# Patient Record
Sex: Female | Born: 1945 | Race: Black or African American | Hispanic: No | Marital: Married | Smoking: Former smoker
Health system: Southern US, Community
[De-identification: ages and names within clinical notes are randomized; demographics above are authoritative.]

## PROBLEM LIST (undated history)

## (undated) DIAGNOSIS — H269 Unspecified cataract: Secondary | ICD-10-CM

## (undated) DIAGNOSIS — H547 Unspecified visual loss: Secondary | ICD-10-CM

## (undated) DIAGNOSIS — G4733 Obstructive sleep apnea (adult) (pediatric): Secondary | ICD-10-CM

## (undated) DIAGNOSIS — R7303 Prediabetes: Secondary | ICD-10-CM

## (undated) DIAGNOSIS — M1990 Unspecified osteoarthritis, unspecified site: Secondary | ICD-10-CM

## (undated) DIAGNOSIS — G4730 Sleep apnea, unspecified: Secondary | ICD-10-CM

## (undated) DIAGNOSIS — K219 Gastro-esophageal reflux disease without esophagitis: Secondary | ICD-10-CM

## (undated) DIAGNOSIS — K589 Irritable bowel syndrome without diarrhea: Secondary | ICD-10-CM

## (undated) DIAGNOSIS — R6889 Other general symptoms and signs: Secondary | ICD-10-CM

## (undated) DIAGNOSIS — L409 Psoriasis, unspecified: Secondary | ICD-10-CM

## (undated) DIAGNOSIS — I10 Essential (primary) hypertension: Secondary | ICD-10-CM

## (undated) DIAGNOSIS — I341 Nonrheumatic mitral (valve) prolapse: Secondary | ICD-10-CM

## (undated) DIAGNOSIS — K922 Gastrointestinal hemorrhage, unspecified: Secondary | ICD-10-CM

## (undated) DIAGNOSIS — I509 Heart failure, unspecified: Secondary | ICD-10-CM

## (undated) DIAGNOSIS — K5790 Diverticulosis of intestine, part unspecified, without perforation or abscess without bleeding: Secondary | ICD-10-CM

## (undated) HISTORY — DX: Psoriasis, unspecified: L40.9

## (undated) HISTORY — DX: Unspecified osteoarthritis, unspecified site: M19.90

## (undated) HISTORY — DX: Obstructive sleep apnea (adult) (pediatric): G47.33

## (undated) HISTORY — PX: APPENDECTOMY: SHX54

## (undated) HISTORY — PX: CATARACT EXTRACTION: SUR2

## (undated) HISTORY — PX: FOOT SURGERY: SHX648

## (undated) HISTORY — DX: Diverticulosis of intestine, part unspecified, without perforation or abscess without bleeding: K57.90

## (undated) HISTORY — DX: Gastro-esophageal reflux disease without esophagitis: K21.9

## (undated) HISTORY — DX: Unspecified cataract: H26.9

## (undated) HISTORY — DX: Unspecified visual loss: H54.7

## (undated) HISTORY — DX: Nonrheumatic mitral (valve) prolapse: I34.1

## (undated) HISTORY — DX: Prediabetes: R73.03

## (undated) HISTORY — DX: Gastrointestinal hemorrhage, unspecified: K92.2

## (undated) HISTORY — DX: Irritable bowel syndrome, unspecified: K58.9

## (undated) HISTORY — DX: Other general symptoms and signs: R68.89

---

## 2006-12-15 HISTORY — PX: PARTIAL HYSTERECTOMY: SHX80

## 2019-10-30 ENCOUNTER — Emergency Department (HOSPITAL_COMMUNITY): Payer: Medicare Other

## 2019-10-30 ENCOUNTER — Encounter (HOSPITAL_COMMUNITY): Payer: Self-pay

## 2019-10-30 ENCOUNTER — Inpatient Hospital Stay (HOSPITAL_COMMUNITY)
Admission: EM | Admit: 2019-10-30 | Discharge: 2019-11-01 | DRG: 378 | Disposition: A | Discharge status: Home / Self Care | Payer: Medicare Other | Attending: Internal Medicine | Admitting: Internal Medicine

## 2019-10-30 ENCOUNTER — Other Ambulatory Visit: Payer: Self-pay

## 2019-10-30 DIAGNOSIS — I10 Essential (primary) hypertension: Secondary | ICD-10-CM

## 2019-10-30 DIAGNOSIS — Z8719 Personal history of other diseases of the digestive system: Secondary | ICD-10-CM

## 2019-10-30 DIAGNOSIS — K5731 Diverticulosis of large intestine without perforation or abscess with bleeding: Principal | ICD-10-CM

## 2019-10-30 DIAGNOSIS — R079 Chest pain, unspecified: Secondary | ICD-10-CM

## 2019-10-30 DIAGNOSIS — Z7982 Long term (current) use of aspirin: Secondary | ICD-10-CM

## 2019-10-30 DIAGNOSIS — Z79899 Other long term (current) drug therapy: Secondary | ICD-10-CM

## 2019-10-30 DIAGNOSIS — Z20828 Contact with and (suspected) exposure to other viral communicable diseases: Secondary | ICD-10-CM | POA: Diagnosis present

## 2019-10-30 DIAGNOSIS — I110 Hypertensive heart disease with heart failure: Secondary | ICD-10-CM | POA: Diagnosis present

## 2019-10-30 DIAGNOSIS — K625 Hemorrhage of anus and rectum: Secondary | ICD-10-CM | POA: Diagnosis present

## 2019-10-30 DIAGNOSIS — Z8711 Personal history of peptic ulcer disease: Secondary | ICD-10-CM

## 2019-10-30 DIAGNOSIS — Z8249 Family history of ischemic heart disease and other diseases of the circulatory system: Secondary | ICD-10-CM

## 2019-10-30 DIAGNOSIS — I509 Heart failure, unspecified: Secondary | ICD-10-CM

## 2019-10-30 DIAGNOSIS — K589 Irritable bowel syndrome without diarrhea: Secondary | ICD-10-CM | POA: Diagnosis present

## 2019-10-30 DIAGNOSIS — K635 Polyp of colon: Secondary | ICD-10-CM | POA: Diagnosis present

## 2019-10-30 DIAGNOSIS — Q438 Other specified congenital malformations of intestine: Secondary | ICD-10-CM

## 2019-10-30 DIAGNOSIS — Z833 Family history of diabetes mellitus: Secondary | ICD-10-CM

## 2019-10-30 DIAGNOSIS — D649 Anemia, unspecified: Secondary | ICD-10-CM

## 2019-10-30 DIAGNOSIS — K219 Gastro-esophageal reflux disease without esophagitis: Secondary | ICD-10-CM

## 2019-10-30 DIAGNOSIS — K922 Gastrointestinal hemorrhage, unspecified: Secondary | ICD-10-CM | POA: Diagnosis not present

## 2019-10-30 DIAGNOSIS — Z87891 Personal history of nicotine dependence: Secondary | ICD-10-CM

## 2019-10-30 DIAGNOSIS — Z90710 Acquired absence of both cervix and uterus: Secondary | ICD-10-CM

## 2019-10-30 DIAGNOSIS — G4730 Sleep apnea, unspecified: Secondary | ICD-10-CM | POA: Diagnosis present

## 2019-10-30 HISTORY — DX: Sleep apnea, unspecified: G47.30

## 2019-10-30 HISTORY — DX: Essential (primary) hypertension: I10

## 2019-10-30 HISTORY — DX: Heart failure, unspecified: I50.9

## 2019-10-30 LAB — CBC WITH DIFFERENTIAL/PLATELET
Abs Immature Granulocytes: 0.02 10*3/uL (ref 0.00–0.07)
Abs Immature Granulocytes: 0.02 10*3/uL (ref 0.00–0.07)
Abs Immature Granulocytes: 0.04 10*3/uL (ref 0.00–0.07)
Basophils Absolute: 0.1 10*3/uL (ref 0.0–0.1)
Basophils Absolute: 0 10*3/uL (ref 0.0–0.1)
Basophils Absolute: 0 10*3/uL (ref 0.0–0.1)
Basophils Relative: 1 %
Basophils Relative: 0 %
Basophils Relative: 0 %
Eosinophils Absolute: 0.1 10*3/uL (ref 0.0–0.5)
Eosinophils Absolute: 0 10*3/uL (ref 0.0–0.5)
Eosinophils Absolute: 0 10*3/uL (ref 0.0–0.5)
Eosinophils Relative: 1 %
Eosinophils Relative: 0 %
Eosinophils Relative: 0 %
HCT: 37.7 % (ref 36.0–46.0)
HCT: 31.9 % — ABNORMAL LOW (ref 36.0–46.0)
HCT: 36.8 % (ref 36.0–46.0)
Hemoglobin: 12.1 g/dL (ref 12.0–15.0)
Hemoglobin: 9.7 g/dL — ABNORMAL LOW (ref 12.0–15.0)
Hemoglobin: 12.1 g/dL (ref 12.0–15.0)
Immature Granulocytes: 0 %
Immature Granulocytes: 0 %
Immature Granulocytes: 0 %
Lymphocytes Relative: 37 %
Lymphocytes Relative: 51 %
Lymphocytes Relative: 26 %
Lymphs Abs: 2.7 10*3/uL (ref 0.7–4.0)
Lymphs Abs: 5 10*3/uL — ABNORMAL HIGH (ref 0.7–4.0)
Lymphs Abs: 3.1 10*3/uL (ref 0.7–4.0)
MCH: 29.8 pg (ref 26.0–34.0)
MCH: 29.6 pg (ref 26.0–34.0)
MCH: 29.4 pg (ref 26.0–34.0)
MCHC: 32.1 g/dL (ref 30.0–36.0)
MCHC: 30.4 g/dL (ref 30.0–36.0)
MCHC: 32.9 g/dL (ref 30.0–36.0)
MCV: 92.9 fL (ref 80.0–100.0)
MCV: 97.3 fL (ref 80.0–100.0)
MCV: 89.5 fL (ref 80.0–100.0)
Monocytes Absolute: 0.4 10*3/uL (ref 0.1–1.0)
Monocytes Absolute: 0.5 10*3/uL (ref 0.1–1.0)
Monocytes Absolute: 0.6 10*3/uL (ref 0.1–1.0)
Monocytes Relative: 5 %
Monocytes Relative: 5 %
Monocytes Relative: 5 %
Neutro Abs: 4.2 10*3/uL (ref 1.7–7.7)
Neutro Abs: 4.3 10*3/uL (ref 1.7–7.7)
Neutro Abs: 8.2 10*3/uL — ABNORMAL HIGH (ref 1.7–7.7)
Neutrophils Relative %: 56 %
Neutrophils Relative %: 44 %
Neutrophils Relative %: 69 %
Platelets: 257 10*3/uL (ref 150–400)
Platelets: 282 10*3/uL (ref 150–400)
Platelets: 209 10*3/uL (ref 150–400)
RBC: 4.06 MIL/uL (ref 3.87–5.11)
RBC: 3.28 MIL/uL — ABNORMAL LOW (ref 3.87–5.11)
RBC: 4.11 MIL/uL (ref 3.87–5.11)
RDW: 13.3 % (ref 11.5–15.5)
RDW: 13.3 % (ref 11.5–15.5)
RDW: 14.6 % (ref 11.5–15.5)
WBC: 7.4 10*3/uL (ref 4.0–10.5)
WBC: 9.9 10*3/uL (ref 4.0–10.5)
WBC: 12 10*3/uL — ABNORMAL HIGH (ref 4.0–10.5)
nRBC: 0 % (ref 0.0–0.2)
nRBC: 0 % (ref 0.0–0.2)
nRBC: 0 % (ref 0.0–0.2)

## 2019-10-30 LAB — COMPREHENSIVE METABOLIC PANEL
ALT: 14 U/L (ref 0–44)
AST: 21 U/L (ref 15–41)
Albumin: 3.6 g/dL (ref 3.5–5.0)
Alkaline Phosphatase: 62 U/L (ref 38–126)
Anion gap: 13 (ref 5–15)
BUN: 10 mg/dL (ref 8–23)
CO2: 24 mmol/L (ref 22–32)
Calcium: 8.7 mg/dL — ABNORMAL LOW (ref 8.9–10.3)
Chloride: 103 mmol/L (ref 98–111)
Creatinine, Ser: 0.66 mg/dL (ref 0.44–1.00)
GFR calc Af Amer: >60 mL/min (ref >60)
GFR calc non Af Amer: >60 mL/min (ref >60)
Glucose, Bld: 142 mg/dL — ABNORMAL HIGH (ref 70–99)
Potassium: 3.9 mmol/L (ref 3.5–5.1)
Sodium: 140 mmol/L (ref 135–145)
Total Bilirubin: 0.7 mg/dL (ref 0.3–1.2)
Total Protein: 6.4 g/dL — ABNORMAL LOW (ref 6.5–8.1)

## 2019-10-30 LAB — LIPASE, BLOOD: Lipase: 36 U/L (ref 11–51)

## 2019-10-30 LAB — PREPARE RBC (CROSSMATCH)

## 2019-10-30 LAB — SARS CORONAVIRUS 2 (TAT 6-24 HRS): SARS Coronavirus 2: NEGATIVE

## 2019-10-30 LAB — ABO/RH: ABO/RH(D): B POS

## 2019-10-30 LAB — BRAIN NATRIURETIC PEPTIDE: B Natriuretic Peptide: 72.1 pg/mL (ref 0.0–100.0)

## 2019-10-30 LAB — TROPONIN I (HIGH SENSITIVITY)
Troponin I (High Sensitivity): 9 ng/L (ref <18)
Troponin I (High Sensitivity): 10 ng/L (ref <18)

## 2019-10-30 MED ORDER — MORPHINE SULFATE (PF) 4 MG/ML IV SOLN
4.0000 mg | Freq: Once | INTRAVENOUS | Status: AC
  Administered 2019-10-30: 4 mg via INTRAVENOUS
  Filled 2019-10-30: qty 1

## 2019-10-30 MED ORDER — SODIUM CHLORIDE 0.9% FLUSH
3.0000 mL | Freq: Two times a day (BID) | INTRAVENOUS | Status: DC
  Administered 2019-10-30 – 2019-10-31 (×3): 3 mL via INTRAVENOUS

## 2019-10-30 MED ORDER — SODIUM CHLORIDE 0.9 % IV BOLUS
1000.0000 mL | Freq: Once | INTRAVENOUS | Status: AC
  Administered 2019-10-30: 1000 mL via INTRAVENOUS

## 2019-10-30 MED ORDER — DONEPEZIL HCL 23 MG PO TABS
23.0000 mg | ORAL_TABLET | Freq: Every day | ORAL | Status: DC
  Administered 2019-10-30 – 2019-10-31 (×2): 23 mg via ORAL
  Filled 2019-10-30 (×3): qty 1

## 2019-10-30 MED ORDER — PANTOPRAZOLE SODIUM 40 MG PO TBEC
40.0000 mg | DELAYED_RELEASE_TABLET | Freq: Two times a day (BID) | ORAL | Status: DC
  Administered 2019-10-30 – 2019-11-01 (×5): 40 mg via ORAL
  Filled 2019-10-30 (×5): qty 1

## 2019-10-30 MED ORDER — MONTELUKAST SODIUM 10 MG PO TABS
10.0000 mg | ORAL_TABLET | Freq: Every day | ORAL | Status: DC
  Administered 2019-10-30 – 2019-11-01 (×3): 10 mg via ORAL
  Filled 2019-10-30 (×3): qty 1

## 2019-10-30 MED ORDER — SODIUM CHLORIDE 0.9 % IV BOLUS
500.0000 mL | Freq: Once | INTRAVENOUS | Status: AC
  Administered 2019-10-30: 500 mL via INTRAVENOUS

## 2019-10-30 MED ORDER — IOHEXOL 350 MG/ML SOLN
100.0000 mL | Freq: Once | INTRAVENOUS | Status: AC | PRN
  Administered 2019-10-30: 100 mL via INTRAVENOUS

## 2019-10-30 MED ORDER — SODIUM CHLORIDE 0.9 % IV SOLN
10.0000 mL/h | Freq: Once | INTRAVENOUS | Status: AC
  Administered 2019-11-01: 08:00:00 via INTRAVENOUS

## 2019-10-30 MED ORDER — ACETAMINOPHEN 650 MG RE SUPP: 650.0000 mg | Freq: Four times a day (QID) | RECTAL | Status: DC | PRN

## 2019-10-30 MED ORDER — ACETAMINOPHEN 325 MG PO TABS
650.0000 mg | ORAL_TABLET | Freq: Four times a day (QID) | ORAL | Status: DC | PRN
  Administered 2019-10-30 – 2019-10-31 (×3): 650 mg via ORAL
  Filled 2019-10-30 (×3): qty 2

## 2019-10-30 MED ORDER — ONDANSETRON HCL 4 MG/2ML IJ SOLN
4.0000 mg | Freq: Once | INTRAMUSCULAR | Status: AC
  Administered 2019-10-30: 4 mg via INTRAVENOUS
  Filled 2019-10-30: qty 2

## 2019-10-30 NOTE — ED Notes (Signed)
Moderate size maroon colored bowel movement.

## 2019-10-30 NOTE — ED Notes (Signed)
Pt had witnessed syncopal episode while moving her bowels. HR down to 40. MD to bedside.

## 2019-10-30 NOTE — Consult Note (Signed)
Consult Note for [facility]  Reason for Consult: Hematochezia Referring Physician: [facility]  Hardie Pulley HPI: This is a 73 year old female with a PMH of CHF, HTN, and sleep apnea admitted for hematochezia.  She had a total of three blood bowel movements from her home and in the ER, initially, but she continued to have significant hematochezia in the ER.  It was to the point that she was hypotensive and she suffered with a vagal event.  Her HR dropped into the 40s.  Her HGB was noted to be at 12.1 on admission and then it dropped down to 9.7 g/dL.  A CTA was performed, but there was no evidence of any active bleeding.  Since that time her bleeding arrested and she is stable.  The CTA did reveal a pandiverticulosis.  Her last colonoscopy was in January when she was living in [place] and she reports having multiple procedures in the past.  There is a history of polyps, her report.  Past Medical History:  Diagnosis Date  . CHF (congestive heart failure) ([place])   . Hypertension   . Sleep apnea     Past Surgical History:  Procedure Laterality Date  . ABDOMINAL HYSTERECTOMY  2008   partial    History reviewed. No pertinent family history.  Social History:  reports that she has quit smoking. She has never used smokeless tobacco. She reports that she does not drink alcohol or use drugs.  Allergies: No Known Allergies  Medications:  Scheduled:  Continuous: . sodium chloride      Results for orders placed or performed during the hospital encounter of 10/30/19 (from the past 24 hour(s))  Comprehensive metabolic panel     Status: Abnormal   Collection Time: 10/30/19  1:16 AM  Result Value Ref Range   Sodium 140 135 - 145 mmol/L   Potassium 3.9 3.5 - 5.1 mmol/L   Chloride 103 98 - 111 mmol/L   CO2 24 22 - 32 mmol/L   Glucose, Bld 142 (H) 70 - 99 mg/dL   BUN 10 8 - 23 mg/dL   Creatinine, Ser 0.66 0.44 - 1.00 mg/dL   Calcium 8.7 (L) 8.9 - 10.3 mg/dL   Total Protein 6.4 (L) 6.5 - 8.1  g/dL   Albumin 3.6 3.5 - 5.0 g/dL   AST 21 15 - 41 U/L   ALT 14 0 - 44 U/L   Alkaline Phosphatase 62 38 - 126 U/L   Total Bilirubin 0.7 0.3 - 1.2 mg/dL   GFR calc non Af Amer >60 >60 mL/min   GFR calc Af Amer >60 >60 mL/min   Anion gap 13 5 - 15  Lipase, blood     Status: None   Collection Time: 10/30/19  1:16 AM  Result Value Ref Range   Lipase 36 11 - 51 U/L  CBC with Differential     Status: None   Collection Time: 10/30/19  1:16 AM  Result Value Ref Range   WBC 7.4 4.0 - 10.5 K/uL   RBC 4.06 3.87 - 5.11 MIL/uL   Hemoglobin 12.1 12.0 - 15.0 g/dL   HCT 37.7 36.0 - 46.0 %   MCV 92.9 80.0 - 100.0 fL   MCH 29.8 26.0 - 34.0 pg   MCHC 32.1 30.0 - 36.0 g/dL   RDW 13.3 11.5 - 15.5 %   Platelets 257 150 - 400 K/uL   nRBC 0.0 0.0 - 0.2 %   Neutrophils Relative % 56 %   Neutro  Abs 4.2 1.7 - 7.7 K/uL   Lymphocytes Relative 37 %   Lymphs Abs 2.7 0.7 - 4.0 K/uL   Monocytes Relative 5 %   Monocytes Absolute 0.4 0.1 - 1.0 K/uL   Eosinophils Relative 1 %   Eosinophils Absolute 0.1 0.0 - 0.5 K/uL   Basophils Relative 1 %   Basophils Absolute 0.1 0.0 - 0.1 K/uL   Immature Granulocytes 0 %   Abs Immature Granulocytes 0.02 0.00 - 0.07 K/uL  Troponin I (High Sensitivity)     Status: None   Collection Time: 10/30/19  1:16 AM  Result Value Ref Range   Troponin I (High Sensitivity) 9 <18 ng/L  Brain natriuretic peptide     Status: None   Collection Time: 10/30/19  1:17 AM  Result Value Ref Range   B Natriuretic Peptide 72.1 0.0 - 100.0 pg/mL  Type and screen [place]     Status: None (Preliminary result)   Collection Time: 10/30/19  2:35 AM  Result Value Ref Range   ABO/RH(D) B POS    Antibody Screen NEG    Sample Expiration 11/02/2019,2359    Unit Number [id number]    Blood Component Type RED CELLS,LR    Unit division 00    Status of Unit ISSUED    Transfusion Status OK TO TRANSFUSE    Crossmatch Result Compatible    Unit Number [id number]    Blood  Component Type RED CELLS,LR    Unit division 00    Status of Unit ISSUED    Transfusion Status OK TO TRANSFUSE    Crossmatch Result      Compatible Performed at [facility], [place]. [address] [place], [place] [address]   ABO/Rh     Status: None   Collection Time: 10/30/19  2:35 AM  Result Value Ref Range   ABO/RH(D)      B POS Performed at [facility] [address] [place], [place] [address]   CBC with Differential     Status: Abnormal   Collection Time: 10/30/19  2:54 AM  Result Value Ref Range   WBC 9.9 4.0 - 10.5 K/uL   RBC 3.28 (L) 3.87 - 5.11 MIL/uL   Hemoglobin 9.7 (L) 12.0 - 15.0 g/dL   HCT 31.9 (L) 36.0 - 46.0 %   MCV 97.3 80.0 - 100.0 fL   MCH 29.6 26.0 - 34.0 pg   MCHC 30.4 30.0 - 36.0 g/dL   RDW 13.3 11.5 - 15.5 %   Platelets 282 150 - 400 K/uL   nRBC 0.0 0.0 - 0.2 %   Neutrophils Relative % 44 %   Neutro Abs 4.3 1.7 - 7.7 K/uL   Lymphocytes Relative 51 %   Lymphs Abs 5.0 (H) 0.7 - 4.0 K/uL   Monocytes Relative 5 %   Monocytes Absolute 0.5 0.1 - 1.0 K/uL   Eosinophils Relative 0 %   Eosinophils Absolute 0.0 0.0 - 0.5 K/uL   Basophils Relative 0 %   Basophils Absolute 0.0 0.0 - 0.1 K/uL   Immature Granulocytes 0 %   Abs Immature Granulocytes 0.02 0.00 - 0.07 K/uL  Prepare RBC     Status: None   Collection Time: 10/30/19  2:56 AM  Result Value Ref Range   Order Confirmation      ORDER PROCESSED BY BLOOD BANK Performed at [facility], [address] [place], [place] [address]   Troponin I (High Sensitivity)     Status:  None   Collection Time: 10/30/19  3:16 AM  Result Value Ref Range   Troponin I (High Sensitivity) 10 <18 ng/L     Dg Chest Portable 1 View  Result Date: 10/30/2019 CLINICAL DATA:  Chest pain EXAM: PORTABLE CHEST 1 VIEW COMPARISON:  None. FINDINGS: The heart size and mediastinal contours are within normal limits. Both lungs are clear. The visualized skeletal structures are unremarkable. IMPRESSION: No active disease.  Electronically Signed   By: Inez Catalina M.D.   On: 10/30/2019 01:29   Ct Angio Abd/pel W And/or Wo Contrast  Result Date: 10/30/2019 CLINICAL DATA:  Lower abdominal and mid chest pain beginning last evening. Lower GI bleeding. EXAM: CTA ABDOMEN AND PELVIS WITHOUT AND WITH CONTRAST TECHNIQUE: Multidetector CT imaging of the abdomen and pelvis was performed using the standard protocol during bolus administration of intravenous contrast. Multiplanar reconstructed images and MIPs were obtained and reviewed to evaluate the vascular anatomy. CONTRAST:  137mL OMNIPAQUE IOHEXOL 350 MG/ML SOLN COMPARISON:  None. FINDINGS: VASCULAR Aorta: The descending thoracic aorta is noted to be markedly tortuous but of normal caliber. Normal caliber the abdominal aorta. The abdominal aorta is widely patent without evidence of a hemodynamically significant stenosis. No abdominal aortic dissection or periaortic stranding. Celiac: Widely patent without hemodynamically significant narrowing. Conventional branching pattern. SMA: Widely patent without a hemodynamically significant narrowing. Conventional branching pattern. Renals: Solitary bilaterally; the bilateral renal arteries are widely patent without hemodynamically significant narrowing. No vessel irregularity to suggest FMD. IMA: Widely patent without hemodynamically significant narrowing. Inflow: The bilateral common and external iliac arteries are tortuous but of normal caliber and without a hemodynamically significant stenosis. The bilateral internal iliac arteries are of normal caliber and widely patent. Proximal Outflow: The bilateral common and imaged portions of the bilateral deep and superficial femoral arteries are widely patent without hemodynamically significant narrowing. Veins: The IVC and pelvic venous systems are widely patent without a hemodynamically significant narrowing. Review of the MIP images confirms the above findings.  _________________________________________________________ NON-VASCULAR Lower chest: Limited visualization of the lower thorax demonstrates minimal subsegmental atelectasis involving the caudal aspect of the lingula and adjacent to the tortuous descending thoracic aorta. No discrete focal airspace opacities. No pleural effusion. Normal heart size.  No pericardial effusion. Hepatobiliary: Mild nodularity of the hepatic contour. Scattered punctate (subcentimeter) hypoattenuating hepatic lesions are too small to adequately characterize. Normal appearance of the gallbladder given degree distention. No radiopaque gallstones. No intra or extrahepatic biliary ductal dilatation. No ascites. Pancreas: Normal appearance of the pancreas. Spleen: Normal appearance of the spleen. Adrenals/Urinary Tract: There is symmetric enhancement and excretion of the bilateral kidneys. No definite renal stones on this postcontrast examination. No discrete renal lesions. No urinary obstruction or perinephric stranding. There is mild thickening of the bilateral adrenal glands without discrete nodule. Normal appearance of the urinary bladder given degree distention. Stomach/Bowel: Rather extensive colonic diverticulosis without evidence of superimposed acute diverticulitis. Normal appearance of the terminal ileum. The appendix is not visualized, however there is no pericecal inflammatory change. No pneumoperitoneum, pneumatosis or portal venous gas. There are no discrete areas of intraluminal contrast extravasation to identify a source of upper or lower GI bleeding. Lymphatic: No bulky retroperitoneal, mesenteric, pelvic or inguinal lymph adenopathy. Reproductive: Normal appearance of the pelvic organs. No discrete adnexal lesion. No free fluid the pelvic cul-de-sac. Other: Diffuse body wall anasarca. Note is made of a small peri umbilical hernia which contains a portion of nondilated loop of small bowel. Musculoskeletal: No acute or aggressive  osseous abnormalities. Mild-to-moderate multilevel lumbar spine DDD. IMPRESSION: 1. No acute findings within the abdomen or pelvis. Specifically, a definitive source of GI bleeding is not identified. 2. Unremarkable CTA of the abdomen and pelvis. 3. Extensive colonic diverticulosis without evidence of superimposed acute diverticulitis. Electronically Signed   By: Sandi Mariscal M.D.   On: 10/30/2019 08:34    ROS:  As stated above in the HPI otherwise negative.  Blood pressure (!) 158/116, pulse 75, temperature 98.3 F (36.8 C), temperature source Oral, resp. rate (!) 23, SpO2 100 %.    PE: Gen: NAD, Alert and Oriented HEENT:  [place]/AT, EOMI Neck: Supple, no LAD Lungs: CTA Bilaterally CV: RRR without M/G/R ABM: Soft, NTND, +BS, obese Ext: No C/C/E  Assessment/Plan: 1) Diverticular bleed. 2) Anemia. 3) History of polyps.   Unfortunately the records for her prior procedures cannot be obtained.  Her last procedure was in January, per her report.  She most likely has a diverticular bleed, but in the absence of reviewing prior records, and the severity of her bleed, she will be scheduled for a colonoscopy tomorrow.  Currently she is hemodynamically stable  Plan: 1) Colonoscopy tomorrow with Dr. Loletha Carrow. 2) Follow HGB.  Dajsha Massaro D 10/30/2019, 9:55 AM

## 2019-10-30 NOTE — ED Provider Notes (Signed)
[facility] EMERGENCY DEPARTMENT Provider Note   CSN: [id number] Arrival date & time: 10/30/19  0031     History   Chief Complaint Chief Complaint  Patient presents with  . Rectal Bleeding    HPI Yolanda Lang is a 73 y.o. female with history of hypertension, CHF who presents with rectal bleeding that began this evening as patient was getting ready for bed.  Patient reports she was sitting on the toilet trying to have a bowel movement when she thought she was urinating, but she wiped and saw straight blood.  She passed several clots.  She has had 2 or 3 more episodes of large amounts of blood with clots since 11 PM.  Patient reports she still feels like she has to have a bowel movement, but denies any significant pain.  She had a little bit of nausea, but no vomiting.  She has had some intermittent lightheadedness.  Patient reports for the past week she has had intermittent chest discomfort that she states prevents her from lying flat.  She denies any significant shortness of breath.  She has noted some increased leg swelling bilaterally.  She takes Lasix for this.  Patient has no history of rectal bleeding.     HPI  Past Medical History:  Diagnosis Date  . CHF (congestive heart failure) ([place])   . Hypertension   . Sleep apnea     There are no active problems to display for this patient.   Past Surgical History:  Procedure Laterality Date  . ABDOMINAL HYSTERECTOMY  2008   partial     OB History   No obstetric history on file.      Home Medications    Prior to Admission medications   Medication Sig Start Date End Date Taking? Authorizing Provider  aspirin EC 81 MG tablet Take 81 mg by mouth daily.   Yes [provider]  clotrimazole-betamethasone (LOTRISONE) cream Apply 1 application topically 2 (two) times daily as needed (for rash).   Yes [provider]  diphenoxylate-atropine (LOMOTIL) 2.5-0.025 MG tablet Take 1-2 tablets by mouth  2 (two) times daily as needed for diarrhea or loose stools.   Yes [provider]  donepezil (ARICEPT) 23 MG TABS tablet Take 23 mg by mouth at bedtime. 10/20/19  Yes [provider]  furosemide (LASIX) 40 MG tablet Take 40 mg by mouth daily. 09/20/19  Yes [provider]  montelukast (SINGULAIR) 10 MG tablet Take 10 mg by mouth daily. 10/19/19  Yes [provider]  pantoprazole (PROTONIX) 40 MG tablet Take 40 mg by mouth 2 (two) times daily. 09/24/19  Yes [provider]  valsartan (DIOVAN) 320 MG tablet Take 320 mg by mouth daily. 09/20/19  Yes [provider]    Family History History reviewed. No pertinent family history.  Social History Social History   Tobacco Use  . Smoking status: Former Research scientist (life sciences)  . Smokeless tobacco: Never Used  . Tobacco comment: quit in 1975  Substance Use Topics  . Alcohol use: Never    Frequency: Never  . Drug use: Never     Allergies   Patient has no known allergies.   Review of Systems Review of Systems  Constitutional: Negative for chills and fever.  HENT: Negative for facial swelling and sore throat.   Respiratory: Negative for shortness of breath.   Cardiovascular: Positive for chest pain.  Gastrointestinal: Positive for blood in stool and nausea. Negative for abdominal pain and vomiting.  Genitourinary: Negative  for dysuria.  Musculoskeletal: Negative for back pain.  Skin: Negative for rash and wound.  Neurological: Positive for light-headedness. Negative for headaches.  Psychiatric/Behavioral: The patient is not nervous/anxious.      Physical Exam Updated Vital Signs BP 135/79   Pulse 71   Temp 98.3 F (36.8 C) (Oral)   Resp (!) 21   SpO2 100%   Physical Exam Vitals signs and nursing note reviewed.  Constitutional:      General: She is not in acute distress.    Appearance: She is well-developed. She is not diaphoretic.  HENT:     Head: Normocephalic and atraumatic.      Mouth/Throat:     Pharynx: No oropharyngeal exudate.  Eyes:     General: No scleral icterus.       Right eye: No discharge.        Left eye: No discharge.     Conjunctiva/sclera: Conjunctivae normal.     Pupils: Pupils are equal, round, and reactive to light.     Comments: Mildly pale conjunctiva  Neck:     Musculoskeletal: Normal range of motion and neck supple.     Thyroid: No thyromegaly.  Cardiovascular:     Rate and Rhythm: Normal rate and regular rhythm.     Heart sounds: Normal heart sounds. No murmur. No friction rub. No gallop.   Pulmonary:     Effort: Pulmonary effort is normal. No respiratory distress.     Breath sounds: Normal breath sounds. No stridor. No wheezing or rales.  Abdominal:     General: Bowel sounds are normal. There is no distension.     Palpations: Abdomen is soft.     Tenderness: There is no abdominal tenderness. There is no guarding or rebound.  Genitourinary:    Comments: Right red blood with dark clots noted in the bedside commode Lymphadenopathy:     Cervical: No cervical adenopathy.  Skin:    General: Skin is warm and dry.     Coloration: Skin is not pale.     Findings: No rash.  Neurological:     Mental Status: She is alert.     Coordination: Coordination normal.      ED Treatments / Results  Labs (all labs ordered are listed, but only abnormal results are displayed) Labs Reviewed  COMPREHENSIVE METABOLIC PANEL - Abnormal; Notable for the following components:      Result Value   Glucose, Bld 142 (*)    Calcium 8.7 (*)    Total Protein 6.4 (*)    All other components within normal limits  CBC WITH DIFFERENTIAL/PLATELET - Abnormal; Notable for the following components:   RBC 3.28 (*)    Hemoglobin 9.7 (*)    HCT 31.9 (*)    Lymphs Abs 5.0 (*)    All other components within normal limits  SARS CORONAVIRUS 2 (TAT 6-24 HRS)  LIPASE, BLOOD  CBC WITH DIFFERENTIAL/PLATELET  BRAIN NATRIURETIC PEPTIDE  URINALYSIS, ROUTINE W REFLEX  MICROSCOPIC  CBC WITH DIFFERENTIAL/PLATELET  TYPE AND SCREEN  PREPARE RBC (CROSSMATCH)  ABO/RH  TROPONIN I (HIGH SENSITIVITY)  TROPONIN I (HIGH SENSITIVITY)    EKG EKG Interpretation  Date/Time:  Sunday October 30 2019 01:04:03 EST Ventricular Rate:  94 PR Interval:    QRS Duration: 88 QT Interval:  410 QTC Calculation: 513 R Axis:   11 Text Interpretation: Sinus rhythm Multiple ventricular premature complexes Aberrant complex Probable left atrial enlargement Abnormal R-wave progression, early transition LVH with secondary repolarization abnormality Prolonged QT  interval No old tracing to compare Confirmed by Deno Etienne [phone]) on 10/30/2019 1:39:27 AM   Radiology Dg Chest Portable 1 View  Result Date: 10/30/2019 CLINICAL DATA:  Chest pain EXAM: PORTABLE CHEST 1 VIEW COMPARISON:  None. FINDINGS: The heart size and mediastinal contours are within normal limits. Both lungs are clear. The visualized skeletal structures are unremarkable. IMPRESSION: No active disease. Electronically Signed   By: Inez Catalina M.D.   On: 10/30/2019 01:29    Procedures .Critical Care Performed by: Frederica Kuster, PA-C Authorized by: Frederica Kuster, PA-C   Critical care provider statement:    Critical care time (minutes):  45   Critical care was necessary to treat or prevent imminent or life-threatening deterioration of the following conditions:  Circulatory failure   Critical care was time spent personally by me on the following activities:  Discussions with consultants, evaluation of patient's response to treatment, examination of patient, ordering and performing treatments and interventions, ordering and review of laboratory studies, ordering and review of radiographic studies, pulse oximetry, re-evaluation of patient's condition, obtaining history from patient or surrogate and review of old charts   I assumed direction of critical care for this patient from another provider in my specialty: no      (including critical care time)  Medications Ordered in ED Medications  0.9 %  sodium chloride infusion (has no administration in time range)  sodium chloride 0.9 % bolus 500 mL (0 mLs Intravenous Stopped 10/30/19 0444)  ondansetron (ZOFRAN) injection 4 mg (4 mg Intravenous Given 10/30/19 0300)  morphine 4 MG/ML injection 4 mg (4 mg Intravenous Given 10/30/19 0300)  sodium chloride 0.9 % bolus 1,000 mL (0 mLs Intravenous Stopped 10/30/19 0444)     Initial Impression / Assessment and Plan / ED Course  I have reviewed the triage vital signs and the nursing notes.  Pertinent labs & imaging results that were available during my care of the patient were reviewed by me and considered in my medical decision making (see chart for details).  Clinical Course as of Oct 29 708  Nancy Fetter Oct 30, 2019  0257 Myself and Dr. Tyrone Nine called to the patient's room by nursing staff after she had a syncopal episode while trying to have a bowel movement.  Patient had just had another large bowel movement with straight blood and clots.  Patient awakened, very pale.  Additional IV fluids started.  Will opt for transfusion at this time considering significant blood loss and patient accepts.  GI has been paged.   [AL]    Clinical Course User Index [AL] Frederica Kuster, PA-C       Patient with brisk suspected lower GI bleed.  Patient has no abdominal pain or tenderness.  Patient has had at least 8-10 episodes of large amounts of bright blood per rectum with clots.  Patient has had a 3 g drop in hemoglobin, which I suspect is not yet corrected.  Transfusion initiated following consent, risks explained.  Patient's blood pressure is much improved after IV fluids and following transfusion initiation.  I discussed patient case with gastroenterologist, Dr. Almyra Free, who advised CT angio of the abdomen to assess for site of bleeding.  If this is found, plan to consult interventional radiology.  Ultimately plan to admit to  medicine when more definitive plan. .At shift change, patient care transferred to Charlann Lange, PA-C for continued evaluation, follow up of CT angio abdomen and determination of plan and admit. Patient also evaluated by my attending,  Dr. Tyrone Nine, who guided the patient's management and agrees with plan.  Final Clinical Impressions(s) / ED Diagnoses   Final diagnoses:  Acute GI bleeding    ED Discharge Orders    None       Frederica Kuster, PA-C 10/30/19 [place], [place], DO 10/30/19 [phone]

## 2019-10-30 NOTE — ED Notes (Signed)
ED TO INPATIENT HANDOFF REPORT  ED Nurse Name and Phone #: [phone]  S Name/Age/Gender Yolanda Lang 73 y.o. female Room/Bed: 032C/032C  Code Status   Code Status: Not on file  Home/SNF/Other Home Patient oriented to: self, place, time and situation Is this baseline? Yes   Triage Complete: Triage complete  Chief Complaint rectal bleed   Triage Note Pt bib gcems after c/o rectal bleeding x 1 hour. Bright red blood. Pt also c/o intermittent chest pain that was relieved by tylenol and aspirin. Pt with hx of HTN with BP 220/100, all other VSS   Allergies No Known Allergies  Level of Care/Admitting Diagnosis ED Disposition    None      B Medical/Surgery History Past Medical History:  Diagnosis Date  . CHF (congestive heart failure) ([facility])   . Hypertension   . Sleep apnea    Past Surgical History:  Procedure Laterality Date  . ABDOMINAL HYSTERECTOMY  2008   partial     A IV Location/Drains/Wounds Patient Lines/Drains/Airways Status   Active Line/Drains/Airways    Name:   Placement date:   Placement time:   Site:   Days:   Peripheral IV 10/30/19 Right Hand   10/30/19    0140    Hand   less than 1   Peripheral IV 10/30/19 Left;Lateral Forearm   10/30/19    0354    Forearm   less than 1   Peripheral IV 10/30/19 Left Antecubital   10/30/19    0705    Antecubital   less than 1          Intake/Output Last 24 hours  Intake/Output Summary (Last 24 hours) at 10/30/2019 0919 Last data filed at 10/30/2019 0455 Gross per 24 hour  Intake 315 ml  Output -  Net 315 ml    Labs/Imaging Results for orders placed or performed during the hospital encounter of 10/30/19 (from the past 48 hour(s))  Comprehensive metabolic panel     Status: Abnormal   Collection Time: 10/30/19  1:16 AM  Result Value Ref Range   Sodium 140 135 - 145 mmol/L   Potassium 3.9 3.5 - 5.1 mmol/L   Chloride 103 98 - 111 mmol/L   CO2 24 22 - 32 mmol/L   Glucose, Bld 142 (H) 70 - 99 mg/dL   BUN  10 8 - 23 mg/dL   Creatinine, Ser 0.66 0.44 - 1.00 mg/dL   Calcium 8.7 (L) 8.9 - 10.3 mg/dL   Total Protein 6.4 (L) 6.5 - 8.1 g/dL   Albumin 3.6 3.5 - 5.0 g/dL   AST 21 15 - 41 U/L   ALT 14 0 - 44 U/L   Alkaline Phosphatase 62 38 - 126 U/L   Total Bilirubin 0.7 0.3 - 1.2 mg/dL   GFR calc non Af Amer >60 >60 mL/min   GFR calc Af Amer >60 >60 mL/min   Anion gap 13 5 - 15    Comment: Performed at [facility], [address], [place], [place] [address]  Lipase, blood     Status: None   Collection Time: 10/30/19  1:16 AM  Result Value Ref Range   Lipase 36 11 - 51 U/L    Comment: Performed at [facility] [address] [place], [place] [address]  CBC with Differential     Status: None   Collection Time: 10/30/19  1:16 AM  Result Value Ref Range   WBC 7.4 4.0 - 10.5 K/uL   RBC  4.06 3.87 - 5.11 MIL/uL   Hemoglobin 12.1 12.0 - 15.0 g/dL   HCT 37.7 36.0 - 46.0 %   MCV 92.9 80.0 - 100.0 fL   MCH 29.8 26.0 - 34.0 pg   MCHC 32.1 30.0 - 36.0 g/dL   RDW 13.3 11.5 - 15.5 %   Platelets 257 150 - 400 K/uL   nRBC 0.0 0.0 - 0.2 %   Neutrophils Relative % 56 %   Neutro Abs 4.2 1.7 - 7.7 K/uL   Lymphocytes Relative 37 %   Lymphs Abs 2.7 0.7 - 4.0 K/uL   Monocytes Relative 5 %   Monocytes Absolute 0.4 0.1 - 1.0 K/uL   Eosinophils Relative 1 %   Eosinophils Absolute 0.1 0.0 - 0.5 K/uL   Basophils Relative 1 %   Basophils Absolute 0.1 0.0 - 0.1 K/uL   Immature Granulocytes 0 %   Abs Immature Granulocytes 0.02 0.00 - 0.07 K/uL    Comment: Performed at [facility] [address], [place], [place] [address]  Troponin I (High Sensitivity)     Status: None   Collection Time: 10/30/19  1:16 AM  Result Value Ref Range   Troponin I (High Sensitivity) 9 <18 ng/L    Comment: (NOTE) Elevated high sensitivity troponin I (hsTnI) values and significant  changes across serial measurements may suggest ACS but many other  chronic and acute conditions are known to elevate hsTnI  results.  Refer to the "Links" section for chest pain algorithms and additional  guidance. Performed at [facility], [place] [address] [place], [place] [address]   Brain natriuretic peptide     Status: None   Collection Time: 10/30/19  1:17 AM  Result Value Ref Range   B Natriuretic Peptide 72.1 0.0 - 100.0 pg/mL    Comment: Performed at [facility] [address], [place], [place] [address]  Type and screen [place]     Status: None (Preliminary result)   Collection Time: 10/30/19  2:35 AM  Result Value Ref Range   ABO/RH(D) B POS    Antibody Screen NEG    Sample Expiration 11/02/2019,2359    Unit Number [id number]    Blood Component Type RED CELLS,LR    Unit division 00    Status of Unit ISSUED    Transfusion Status OK TO TRANSFUSE    Crossmatch Result Compatible    Unit Number [id number]    Blood Component Type RED CELLS,LR    Unit division 00    Status of Unit ISSUED    Transfusion Status OK TO TRANSFUSE    Crossmatch Result      Compatible Performed at [facility], [place] [address] [place], [place] [address]   ABO/Rh     Status: None   Collection Time: 10/30/19  2:35 AM  Result Value Ref Range   ABO/RH(D)      B POS Performed at [facility] [address], [place], [place] [address]   CBC with Differential     Status: Abnormal   Collection Time: 10/30/19  2:54 AM  Result Value Ref Range   WBC 9.9 4.0 - 10.5 K/uL   RBC 3.28 (L) 3.87 - 5.11 MIL/uL   Hemoglobin 9.7 (L) 12.0 - 15.0 g/dL   HCT 31.9 (L) 36.0 - 46.0 %   MCV 97.3 80.0 - 100.0 fL   MCH 29.6 26.0 - 34.0 pg   MCHC 30.4 30.0 - 36.0 g/dL  RDW 13.3 11.5 - 15.5 %   Platelets 282 150 - 400 K/uL   nRBC 0.0 0.0 - 0.2 %   Neutrophils Relative % 44 %   Neutro Abs 4.3 1.7 - 7.7 K/uL   Lymphocytes Relative 51 %   Lymphs Abs 5.0 (H) 0.7 - 4.0 K/uL   Monocytes Relative 5 %   Monocytes Absolute 0.5 0.1 - 1.0 K/uL   Eosinophils Relative 0 %   Eosinophils  Absolute 0.0 0.0 - 0.5 K/uL   Basophils Relative 0 %   Basophils Absolute 0.0 0.0 - 0.1 K/uL   Immature Granulocytes 0 %   Abs Immature Granulocytes 0.02 0.00 - 0.07 K/uL    Comment: Performed at [facility] [address], [place], [place] [address]  Prepare RBC     Status: None   Collection Time: 10/30/19  2:56 AM  Result Value Ref Range   Order Confirmation      ORDER PROCESSED BY BLOOD BANK Performed at [facility], [place] [address], [place], [place] [address]   Troponin I (High Sensitivity)     Status: None   Collection Time: 10/30/19  3:16 AM  Result Value Ref Range   Troponin I (High Sensitivity) 10 <18 ng/L    Comment: (NOTE) Elevated high sensitivity troponin I (hsTnI) values and significant  changes across serial measurements may suggest ACS but many other  chronic and acute conditions are known to elevate hsTnI results.  Refer to the "Links" section for chest pain algorithms and additional  guidance. Performed at [facility], [place] [address], [place], [place] [address]    Dg Chest Portable 1 View  Result Date: 10/30/2019 CLINICAL DATA:  Chest pain EXAM: PORTABLE CHEST 1 VIEW COMPARISON:  None. FINDINGS: The heart size and mediastinal contours are within normal limits. Both lungs are clear. The visualized skeletal structures are unremarkable. IMPRESSION: No active disease. Electronically Signed   By: Inez Catalina M.D.   On: 10/30/2019 01:29   Ct Angio Abd/pel W And/or Wo Contrast  Result Date: 10/30/2019 CLINICAL DATA:  Lower abdominal and mid chest pain beginning last evening. Lower GI bleeding. EXAM: CTA ABDOMEN AND PELVIS WITHOUT AND WITH CONTRAST TECHNIQUE: Multidetector CT imaging of the abdomen and pelvis was performed using the standard protocol during bolus administration of intravenous contrast. Multiplanar reconstructed images and MIPs were obtained and reviewed to evaluate the vascular anatomy. CONTRAST:  116mL OMNIPAQUE IOHEXOL 350 MG/ML  SOLN COMPARISON:  None. FINDINGS: VASCULAR Aorta: The descending thoracic aorta is noted to be markedly tortuous but of normal caliber. Normal caliber the abdominal aorta. The abdominal aorta is widely patent without evidence of a hemodynamically significant stenosis. No abdominal aortic dissection or periaortic stranding. Celiac: Widely patent without hemodynamically significant narrowing. Conventional branching pattern. SMA: Widely patent without a hemodynamically significant narrowing. Conventional branching pattern. Renals: Solitary bilaterally; the bilateral renal arteries are widely patent without hemodynamically significant narrowing. No vessel irregularity to suggest FMD. IMA: Widely patent without hemodynamically significant narrowing. Inflow: The bilateral common and external iliac arteries are tortuous but of normal caliber and without a hemodynamically significant stenosis. The bilateral internal iliac arteries are of normal caliber and widely patent. Proximal Outflow: The bilateral common and imaged portions of the bilateral deep and superficial femoral arteries are widely patent without hemodynamically significant narrowing. Veins: The IVC and pelvic venous systems are widely patent without a hemodynamically significant narrowing. Review of the MIP images confirms the above findings. _________________________________________________________ NON-VASCULAR Lower chest:  Limited visualization of the lower thorax demonstrates minimal subsegmental atelectasis involving the caudal aspect of the lingula and adjacent to the tortuous descending thoracic aorta. No discrete focal airspace opacities. No pleural effusion. Normal heart size.  No pericardial effusion. Hepatobiliary: Mild nodularity of the hepatic contour. Scattered punctate (subcentimeter) hypoattenuating hepatic lesions are too small to adequately characterize. Normal appearance of the gallbladder given degree distention. No radiopaque gallstones. No  intra or extrahepatic biliary ductal dilatation. No ascites. Pancreas: Normal appearance of the pancreas. Spleen: Normal appearance of the spleen. Adrenals/Urinary Tract: There is symmetric enhancement and excretion of the bilateral kidneys. No definite renal stones on this postcontrast examination. No discrete renal lesions. No urinary obstruction or perinephric stranding. There is mild thickening of the bilateral adrenal glands without discrete nodule. Normal appearance of the urinary bladder given degree distention. Stomach/Bowel: Rather extensive colonic diverticulosis without evidence of superimposed acute diverticulitis. Normal appearance of the terminal ileum. The appendix is not visualized, however there is no pericecal inflammatory change. No pneumoperitoneum, pneumatosis or portal venous gas. There are no discrete areas of intraluminal contrast extravasation to identify a source of upper or lower GI bleeding. Lymphatic: No bulky retroperitoneal, mesenteric, pelvic or inguinal lymph adenopathy. Reproductive: Normal appearance of the pelvic organs. No discrete adnexal lesion. No free fluid the pelvic cul-de-sac. Other: Diffuse body wall anasarca. Note is made of a small peri umbilical hernia which contains a portion of nondilated loop of small bowel. Musculoskeletal: No acute or aggressive osseous abnormalities. Mild-to-moderate multilevel lumbar spine DDD. IMPRESSION: 1. No acute findings within the abdomen or pelvis. Specifically, a definitive source of GI bleeding is not identified. 2. Unremarkable CTA of the abdomen and pelvis. 3. Extensive colonic diverticulosis without evidence of superimposed acute diverticulitis. Electronically Signed   By: Sandi Mariscal M.D.   On: 10/30/2019 08:34    Pending Labs Unresulted Labs (From admission, onward)    Start     Ordered   10/30/19 0655  CBC with Differential  Once,   STAT     10/30/19 0654   10/30/19 0335  SARS CORONAVIRUS 2 (TAT 6-24 HRS) Nasopharyngeal  Nasopharyngeal Swab  (Asymptomatic/Tier 2)  Once,   STAT    Question Answer Comment  Is this test for diagnosis or screening Screening   Symptomatic for COVID-19 as defined by CDC No   Hospitalized for COVID-19 No   Admitted to ICU for COVID-19 No   Previously tested for COVID-19 No   Resident in a congregate (group) care setting No   Employed in healthcare setting No   Pregnant No      10/30/19 0334   10/30/19 0117  Urinalysis, Routine w reflex microscopic  ONCE - STAT,   STAT     10/30/19 0116          Vitals/Pain Today's Vitals   10/30/19 0715 10/30/19 0730 10/30/19 0830 10/30/19 0850  BP: 135/77 (!) 148/125  (!) 135/97  Pulse: 89 77 83 79  Resp: 17 17 18 18   Temp:   98.4 F (36.9 C) 98.3 F (36.8 C)  TempSrc:   Oral Oral  SpO2: 100% 100% 100%   PainSc:   0-No pain     Isolation Precautions No active isolations  Medications Medications  0.9 %  sodium chloride infusion (has no administration in time range)  sodium chloride 0.9 % bolus 500 mL (0 mLs Intravenous Stopped 10/30/19 0444)  ondansetron (ZOFRAN) injection 4 mg (4 mg Intravenous Given 10/30/19 0300)  morphine 4 MG/ML injection 4  mg (4 mg Intravenous Given 10/30/19 0300)  sodium chloride 0.9 % bolus 1,000 mL (0 mLs Intravenous Stopped 10/30/19 0444)  iohexol (OMNIPAQUE) 350 MG/ML injection 100 mL (100 mLs Intravenous Contrast Given 10/30/19 0802)    Mobility walks Moderate fall risk   Focused Assessments    R Recommendations: See Admitting Provider Note  Report given to:   Additional Notes:

## 2019-10-30 NOTE — ED Notes (Signed)
0700 am CBD not drawn blood infusing.

## 2019-10-30 NOTE — ED Triage Notes (Signed)
Pt bib gcems after c/o rectal bleeding x 1 hour. Bright red blood. Pt also c/o intermittent chest pain that was relieved by tylenol and aspirin. Pt with hx of HTN with BP 220/100, all other VSS

## 2019-10-30 NOTE — H&P (Addendum)
Date: 10/30/2019               Patient Name:  Yolanda Lang MRN: [id number]  DOB: [birth year] Age / Sex: 73 y.o., female   PCP: Loretha Brasil, FNP              Medical Service: Internal Medicine Teaching Service              Attending Physician: Dr. Lucious Groves, DO    First Contact: Roxan Diesel, MS 3 Pager: [phone]  Second Contact: Dr. Darrick Meigs Pager: [phone]  Third Contact Dr. Koleen Distance  Pager: [phone]       After Hours (After 5p/  First Contact Pager: [phone]  weekends / holidays): Second Contact Pager: [phone]   Chief Complaint: Rectal bleeding  History of Present Illness: Yolanda Lang is a 73 y.o. female with PMHx of diverticulosis, CHF, HTN, GERD, gastric ulcer, and IBS who presents with bright red blood per rectum.  Patient states she was sitting on the toilet attempting to have a bowel movement last night around 11pm when symptoms started.  She initially thought she was urinating but then wiped and saw blood, and noticed the toilet was filling with blood.  She also became very lightheaded and weak   She denies rectal pain or abdominal pain although she subsequently developed a "twinge" of abdominal pain after arrival to the ED.  She continued to bleed continuously after arrival to ED 1-2 hours later.  She was given 1500 mL IV NS.  She continued to have bleeding and 2-3 episodes of passing larger amounts of blood with clots, and had a syncopal episode while moving her bowels at 3am, with HR down to 40 and BP 103/55.   Hgb was noted to drop from 12.1 at 1am to 9.7 at 3am so she was transfused 2 units pRBC, with subsequent stabilization of her HR and BP.  She also received 4mg  morphine and a Zofran injection.   Bleeding gradually slowed down and she had a moderate-sized maroon colored BM at around 4am. By 8am bleeding had resolved completely.  Patient states that over the past 3 weeks she has felt generally weak and "sluggish."  She has also had a decreased appetite.  She  reports nausea but denies vomiting.  Over the past week she has also had intermittent chest pain.  Pain is localized to the epigastric area and sometimes lasts up to an entire day.  She describes it as "discomfort."  She is unable to think of any precipitating factors but it is not exertional or positional and seems to come on at random.  It may be relieved by rest.  Yesterday she took one 325mg  aspirin which helped.  She denies h/o MI but does report recent diagnosis of early-stage CHF, and was reportedly told by a cardiologist some time ago to take an aspirin if she ever had chest pain.  She reports associated lightheadedness and clamminess during these episodes.  She reports intermittent SOB which she attributes to CHF but denies SOB associated with chest pain.  She denies prior history of rectal bleeding.  She reports a history of diverticulosis and internal hemorrhoids diagnosed on colonoscopy but denies any prior complications or symptoms of these. She also has a history of IBS diagnosed in 1978 and has had abdominal pain prior to bowel movements on-and-off for years.  She is taking Lomotil for this.  She has had prior episodes of lightheadedness while using the restroom, including one episode last year  of severe lightheadedness, blurred vision and BP of 50/30 while using the bathroom.  She states her symptoms improved after drinking orange juice.  She went to the ED for this episode but was not hospitalized.  Last colonoscopy was 12/2018 and she may have had a few polyps but otherwise denies any new issues.   She also reports history of GERD which is well-controlled on pantoprazole, and gastric ulcer which was reportedly healed on last endoscopy in 12/2018.  She has only taken one 325mg  dose of aspirin in the past week, otherwise takes only 81mg  daily.     Meds:  Current Meds  Medication Sig  . aspirin EC 81 MG tablet Take 81 mg by mouth daily.  . clotrimazole-betamethasone (LOTRISONE) cream Apply 1  application topically 2 (two) times daily as needed (for rash).  . diphenoxylate-atropine (LOMOTIL) 2.5-0.025 MG tablet Take 1-2 tablets by mouth 2 (two) times daily as needed for diarrhea or loose stools.  . donepezil (ARICEPT) 23 MG TABS tablet Take 23 mg by mouth at bedtime.  . furosemide (LASIX) 40 MG tablet Take 40 mg by mouth daily.  . montelukast (SINGULAIR) 10 MG tablet Take 10 mg by mouth daily.  . pantoprazole (PROTONIX) 40 MG tablet Take 40 mg by mouth 2 (two) times daily.  . valsartan (DIOVAN) 320 MG tablet Take 320 mg by mouth daily.     Allergies: Allergies as of 10/30/2019  . (No Known Allergies)   Past Medical History:  Diagnosis Date  . CHF (congestive heart failure) ([facility])   . Hypertension   . Sleep apnea     Family History: She reports a family history of diabetes but denies family history of colon or ovarian cancer.  Social History: Recently moved from [place].  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  Distant smoking history (quit 45 years ago), no ETOH.  Has not been getting much exercise recently due to her fatigue.  Review of Systems: A complete ROS was negative except as per HPI.   Physical Exam: Blood pressure 124/88, pulse 70, temperature 98.3 F (36.8 C), temperature source Oral, resp. rate 18, SpO2 100 %. General: awake, alert, lying comfortably in bed in NAD HEENT: No scleral icterus Pulm: lungs clear to auscultation bilaterally, normal work of breathing on room air CV: regular rate and rhythm, no murmurs, rubs or gallups GI: abdomen soft, nontender, nondistended, normoactive bowel sounds MSK: no peripheral edema Neuro: A&Ox3; no focal deficits Skin: warm and dry  Psych: normal mood and affect  EKG: personally reviewed my interpretation is regular rate, sinus rhythm, normal axis, slightly prolonged QT interval, left ventricular hypertrophy, no ischemic changes  CXR: personally reviewed my interpretation is normal cardiac silhouette, no  pulmonary infiltrates or consolidation, no bony abnormalities  Assessment & Plan by Problem: Principal Problem:   GI bleed Active Problems:   Hypertension   GERD (gastroesophageal reflux disease)   CHF (congestive heart failure) ([facility])   Chest pain  GI bleed: Patient with copious rectal bleeding lasting ~8 hours and requiring 2u pRBC.  Had CTA which showed pandiverticulosis but did not reveal source of bleeding.  She is currently hemodynamically stable.  Leading diagnosis is diverticular hemorrhage given painless rectal bleeding with history of diverticulosis.  This may have been missed on CTA as her bleeding was resolving at that time.  Differential also includes colon cancer.  Relatively recent colonoscopy (12/2018) without evidence of cancer is reassuring but there is some concern given her recent decreased appetite and fatigue.  Also consider internal hemorrhoids although would not typically expect this quantity of blood loss or clotting.  Other possibilities include AVM vs Dieulafoy lesion, although both are less likely to cause lower GI bleed.  Mesenteric ischemia less likely given lack of pain. - GI consulted, planning for colonoscopy tomorrow - No further transfusion needed currently as her bleeding has resolved and vitals and symptoms have stabilized.   - Recheck CBC q6hrs - Hold IVF given BP now hypertensive, consider 533mL NS bolus if BP drops again  Chest pain: History is somewhat vague, difficult to characterize as cardiac vs non-cardiac based on description.  However she has had normal troponin x 2 and EKG with no evidence of ischemic changes, making ACS extremely unlikely. - Continue to monitor  Hypertension: Was taking furosemide 40mg  daily and valsartan 320mg  daily at home. - Hold BP medications given her drop in BP overnight and risk of further hemodynamic instability if bleeding were to resume  Chronic CHF: Patient reports that she was recently diagnosed with CHF (no records  available as patient recently moved from [place]).  However she has normal BNP with no signs of volume overload currently. - Hold home furosemide and aspirin 81mg , resume after bleeding has been addressed  GERD: Well-controlled on pantoprazole per patient - Continue home pantoprazole 40mg  BID   CODE STATUS: FULL Diet: NPO IV fluids: Saline lock, consider 552mL NS bolus if BP drops DVT prophylaxis: SCDs   Dispo: Admit patient to Observation with expected length of stay less than 2 midnights.  Signed: Tonia Ghent, Medical Student 10/30/2019, 11:28 AM  Pager: [phone]   Attestation for Student Documentation:  I personally was present and performed or re-performed the history, physical exam and medical decision-making activities of this service and have verified that the service and findings are accurately documented in the student's note.  Modena Nunnery D, DO 10/30/2019, 12:21 PM   Internal Medicine Attending:   I saw and examined the patient. I reviewed the resident's note and I agree with the resident's findings and plan as documented in the resident's note.  As noted this is a 73 year old female with past medical history of diverticulosis, gastric ulcer, hypertension, GERD, CHF and IBS who presented for painless hematochezia.  HPI is as noted above.  With the addition that I clarified that she is no history of heart attack or stroke she was instructed to take aspirin by her cardiologist due to her history of hypertension family history of coronary artery disease, she does report a cardiac catheterization in 2015 with no significant disease she does note a history of mitral valve prolapse.  On my examination of her she is in no distress she is resting comfortably.  Heart is regular rate and rhythm, lungs are clear to auscultation bilaterally abdomen soft nontender.  She has trace pedal edema bilaterally.  Assessment plan Agree with assessment plan as above  notably GI bleed suspected diverticular. -No further bleeding since last night GI had planned possible colonoscopy however given resolution of bleeding this may be deferred to the outpatient setting will follow up with GI. -I have instructed her to discontinue aspirin as it appears this is for primary prevention.  Hypertension -May resume antihypertensives  Chronic CHF -We do not know her last ejection fraction she will need local PCP to follow.

## 2019-10-30 NOTE — ED Provider Notes (Signed)
Patient to ED with copious rectal bleeding, including clots, since 11:00 pm last night. No similar history. She is taking ASA only - not anticoagulated. No abdominal pain. She is now having syncopal episodes. No chest pain, SOB, fever.   Patient care signed out to me by Armstead Peaks, PA-C (Dr. Tyrone Nine) pending CT angio of abdomen to identify source of bleeding as recommended by Dr. Benson Norway, GI. If source identified, IR may be helpful in treatment. She is currently getting transfused. Hemodynamically stable.   CT scan does not identify the source of bleed. On recheck, the patient is comfortable and has no complaints. She continues to have frequent bowel movements but with less and less bleeding. No further syncope. Still no pain.  Discussed CT findings with Dr. Benson Norway who will consult on the patient and plan for colonoscopy, likely tomorrow. Appropriate COVID test is pending. Admit to medicine.  Patient will be admitted to [facility], Internal medicine who has accepted her on to their service.    Charlann Lange, PA-C 10/30/19 [place], [place], DO 10/31/19 1359

## 2019-10-30 NOTE — ED Notes (Signed)
Orthostatic order discontinued per PA.

## 2019-10-30 NOTE — ED Notes (Signed)
Last bowel movement didn't see any blood in it.

## 2019-10-31 ENCOUNTER — Encounter (HOSPITAL_COMMUNITY)
Admission: EM | Disposition: A | Discharge status: Home / Self Care | Payer: Self-pay | Source: Home / Self Care | Attending: Internal Medicine

## 2019-10-31 DIAGNOSIS — K921 Melena: Secondary | ICD-10-CM

## 2019-10-31 DIAGNOSIS — Z90710 Acquired absence of both cervix and uterus: Secondary | ICD-10-CM | POA: Diagnosis not present

## 2019-10-31 DIAGNOSIS — R079 Chest pain, unspecified: Secondary | ICD-10-CM

## 2019-10-31 DIAGNOSIS — Z8711 Personal history of peptic ulcer disease: Secondary | ICD-10-CM | POA: Diagnosis not present

## 2019-10-31 DIAGNOSIS — D62 Acute posthemorrhagic anemia: Secondary | ICD-10-CM | POA: Diagnosis not present

## 2019-10-31 DIAGNOSIS — Z87891 Personal history of nicotine dependence: Secondary | ICD-10-CM | POA: Diagnosis not present

## 2019-10-31 DIAGNOSIS — Z79899 Other long term (current) drug therapy: Secondary | ICD-10-CM

## 2019-10-31 DIAGNOSIS — I110 Hypertensive heart disease with heart failure: Secondary | ICD-10-CM

## 2019-10-31 DIAGNOSIS — K5790 Diverticulosis of intestine, part unspecified, without perforation or abscess without bleeding: Secondary | ICD-10-CM | POA: Diagnosis not present

## 2019-10-31 DIAGNOSIS — D649 Anemia, unspecified: Secondary | ICD-10-CM | POA: Diagnosis present

## 2019-10-31 DIAGNOSIS — Z9889 Other specified postprocedural states: Secondary | ICD-10-CM

## 2019-10-31 DIAGNOSIS — K635 Polyp of colon: Secondary | ICD-10-CM | POA: Diagnosis present

## 2019-10-31 DIAGNOSIS — D128 Benign neoplasm of rectum: Secondary | ICD-10-CM | POA: Diagnosis not present

## 2019-10-31 DIAGNOSIS — Z833 Family history of diabetes mellitus: Secondary | ICD-10-CM | POA: Diagnosis not present

## 2019-10-31 DIAGNOSIS — K625 Hemorrhage of anus and rectum: Secondary | ICD-10-CM | POA: Diagnosis present

## 2019-10-31 DIAGNOSIS — K219 Gastro-esophageal reflux disease without esophagitis: Secondary | ICD-10-CM

## 2019-10-31 DIAGNOSIS — K922 Gastrointestinal hemorrhage, unspecified: Secondary | ICD-10-CM

## 2019-10-31 DIAGNOSIS — Z8249 Family history of ischemic heart disease and other diseases of the circulatory system: Secondary | ICD-10-CM | POA: Diagnosis not present

## 2019-10-31 DIAGNOSIS — I509 Heart failure, unspecified: Secondary | ICD-10-CM | POA: Diagnosis present

## 2019-10-31 DIAGNOSIS — K589 Irritable bowel syndrome without diarrhea: Secondary | ICD-10-CM

## 2019-10-31 DIAGNOSIS — Q438 Other specified congenital malformations of intestine: Secondary | ICD-10-CM | POA: Diagnosis not present

## 2019-10-31 DIAGNOSIS — R194 Change in bowel habit: Secondary | ICD-10-CM | POA: Diagnosis not present

## 2019-10-31 DIAGNOSIS — K5731 Diverticulosis of large intestine without perforation or abscess with bleeding: Secondary | ICD-10-CM | POA: Diagnosis present

## 2019-10-31 DIAGNOSIS — G4730 Sleep apnea, unspecified: Secondary | ICD-10-CM | POA: Diagnosis present

## 2019-10-31 DIAGNOSIS — Z8719 Personal history of other diseases of the digestive system: Secondary | ICD-10-CM | POA: Diagnosis not present

## 2019-10-31 DIAGNOSIS — Z7982 Long term (current) use of aspirin: Secondary | ICD-10-CM | POA: Diagnosis not present

## 2019-10-31 DIAGNOSIS — K5791 Diverticulosis of intestine, part unspecified, without perforation or abscess with bleeding: Secondary | ICD-10-CM | POA: Diagnosis not present

## 2019-10-31 DIAGNOSIS — Z20828 Contact with and (suspected) exposure to other viral communicable diseases: Secondary | ICD-10-CM | POA: Diagnosis present

## 2019-10-31 LAB — CBC
HCT: 37 % (ref 36.0–46.0)
Hemoglobin: 11.9 g/dL — ABNORMAL LOW (ref 12.0–15.0)
MCH: 29.5 pg (ref 26.0–34.0)
MCHC: 32.2 g/dL (ref 30.0–36.0)
MCV: 91.6 fL (ref 80.0–100.0)
Platelets: 228 10*3/uL (ref 150–400)
RBC: 4.04 MIL/uL (ref 3.87–5.11)
RDW: 14.9 % (ref 11.5–15.5)
WBC: 9.2 10*3/uL (ref 4.0–10.5)
nRBC: 0 % (ref 0.0–0.2)

## 2019-10-31 LAB — TYPE AND SCREEN
ABO/RH(D): B POS
Antibody Screen: NEGATIVE
Unit division: 0
Unit division: 0

## 2019-10-31 LAB — BPAM RBC
Blood Product Expiration Date: 202012042359
Blood Product Expiration Date: 202012092359
ISSUE DATE / TIME: 202011150440
ISSUE DATE / TIME: 202011150823
Unit Type and Rh: 1700
Unit Type and Rh: 1700

## 2019-10-31 LAB — BASIC METABOLIC PANEL
Anion gap: 10 (ref 5–15)
BUN: 7 mg/dL — ABNORMAL LOW (ref 8–23)
CO2: 29 mmol/L (ref 22–32)
Calcium: 8.8 mg/dL — ABNORMAL LOW (ref 8.9–10.3)
Chloride: 104 mmol/L (ref 98–111)
Creatinine, Ser: 0.77 mg/dL (ref 0.44–1.00)
GFR calc Af Amer: >60 mL/min (ref >60)
GFR calc non Af Amer: >60 mL/min (ref >60)
Glucose, Bld: 94 mg/dL (ref 70–99)
Potassium: 3.3 mmol/L — ABNORMAL LOW (ref 3.5–5.1)
Sodium: 143 mmol/L (ref 135–145)

## 2019-10-31 SURGERY — COLONOSCOPY WITH PROPOFOL
Anesthesia: Monitor Anesthesia Care

## 2019-10-31 MED ORDER — PEG-KCL-NACL-NASULF-NA ASC-C 100 G PO SOLR: 1.0000 | Freq: Once | ORAL | Status: DC

## 2019-10-31 MED ORDER — PEG-KCL-NACL-NASULF-NA ASC-C 100 G PO SOLR
0.5000 | Freq: Once | ORAL | Status: AC
  Administered 2019-11-01: 100 g via ORAL

## 2019-10-31 MED ORDER — PEG 3350-KCL-NA BICARB-NACL 420 G PO SOLR
4000.0000 mL | Freq: Once | ORAL | Status: DC
  Filled 2019-10-31: qty 4000

## 2019-10-31 MED ORDER — PEG-KCL-NACL-NASULF-NA ASC-C 100 G PO SOLR
0.5000 | Freq: Once | ORAL | Status: AC
  Administered 2019-10-31: 100 g via ORAL
  Filled 2019-10-31: qty 1

## 2019-10-31 MED ORDER — IRBESARTAN 300 MG PO TABS
300.0000 mg | ORAL_TABLET | Freq: Every day | ORAL | Status: DC
  Administered 2019-10-31 – 2019-11-01 (×2): 300 mg via ORAL
  Filled 2019-10-31 (×2): qty 1

## 2019-10-31 NOTE — Progress Notes (Signed)
   Subjective:  No acute events overnight.  VSS.  She is feeling slightly lightheaded currently but otherwise feels well.  No further bleeding.  Has not had BM today.  She does complain of some RUQ pain which feels similar to her prior gastric ulcer.  No further chest pain since arrival to the hospital.  Did not receive prep last night.  Objective:  Vital signs in last 24 hours: Vitals:   10/31/19 0503 10/31/19 0506 10/31/19 0510 10/31/19 0807  BP: (!) 158/88 (!) 147/83 (!) 150/88 (!) 178/91  Pulse: 71 67 72 73  Resp:    18  Temp: 98.6 F (37 C)   98.6 F (37 C)  TempSrc: Oral   Oral  SpO2: 97% 97% 94% 98%   Weight change:   Intake/Output Summary (Last 24 hours) at 10/31/2019 1255 Last data filed at 10/31/2019 0900 Gross per 24 hour  Intake 600 ml  Output -  Net 600 ml    General: awake, alert, lying comfortably in bed in NAD Pulm: normal work of breathing on room air CV: regular rate and rhythm, no murmurs, rubs or gallups GI: abdomen soft, nontender, nondistended MSK: no peripheral edema Psych: normal mood and affect      Assessment/Plan:  Principal Problem:   GI bleed Active Problems:   Hypertension   GERD (gastroesophageal reflux disease)   CHF (congestive heart failure) ([place])   74 y.o. female with PMHx of diverticulosis, CHF, HTN, GERD, gastric ulcer, and IBS who presented to the ED on 10/30/2019 with bright red blood per rectum.   GI bleed: Bleeding has now resolved.  Hgb stable overnight from 12.1 -> 11.9, with last transfusion yesterday morning.  She is hemodynamically stable.  No need for further IVF or transfusion at this time.  Etiology still unclear given no source of finding seen on CTA yesterday, but diverticular hemorrhage continues to be leading diagnosis. - Will have colonoscopy tomorrow as an inpatient because she did not receive prep last night - Per GI, also needs endoscopy given her abdominal pain reminiscent of prior gastric ulcer, and dark  red-black stools which she reported to GI, raising concern for upper GI bleed, especially with her aspirin use.  Will have this procedure tomorrow as well - Will receive Moviprep tonight - Already taking pantoprazole 40mg  BID; will continue this  Chest pain: Patient denies currently.  No signs or symptoms of ACS.  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  Again denies history of MI and states she is taking asa 81mg  daily for prevention given her risk factors and family history (father died from [place] at age 9). - Discontinue aspirin; given her GI bleed, risk of further bleeding likely outweighs benefits - Consider starting statin, but deferring to outpatient provider (needs to establish care with a local PCP)  Hypertension: Home valsartan and furosemide held on admission due to hypotension overnight in setting of GI bleed.  BP running higher overnight, 147-158/78-88 - Restart home valsartan 320mg  qd  - Hold furosemide 40mg  qd for now but resume prior to discharge  CHF: Still no signs of volume overload.  Last LVEF unknown given lack of prior records - Resume home furosemide 40mg  qd prior to discharge - Initiation of beta-blocker eg carvedilol should be considered in outpatient follow-up  GERD: Continue pantoprazole 40mg  BID  Dispo: Anticipated discharge tomorrow   LOS: 0 days   Tonia Ghent, Medical Student 10/31/2019, 12:55 PM

## 2019-10-31 NOTE — Progress Notes (Signed)
Notified on call phys- no orders for prep- for colonoscopy- no new orders at this time

## 2019-10-31 NOTE — Progress Notes (Signed)
Called endo to see if patient will still have Colonoscopy and they stated that GI Dr will come to see her. Patient  is still NPO. Picked up Prep from Pharmacy. Awaiting further orders.

## 2019-10-31 NOTE — Progress Notes (Addendum)
Progress Note    ASSESSMENT AND PLAN:   73 yo female with GI bleeding. Source unclear.  CT angio abd / pelvis >>> diverticulosis but no active bleed. Describes dark red - black stool yesterday. Proximal colon diverticular bleed. Upper GI bleed? Hgb stable 12.1 >>> 11.9.  --For further evaluation patient will be scheduled for EGD and colonoscopy. The risks and benefits of EGD and colonoscopy with possible polypectomy / biopsies were discussed and the patient agrees to proceed.  --continue BID PPI.  --when we see in clinic can request colonoscopy report from [place] - apparently had polyps   SUBJECTIVE   No bleeding since yesterday around lunch time. Feels okay but nervous about going home without knowing what bled. She describes dark red, nearly black stool yesterday  OBJECTIVE:     Vital signs in last 24 hours: Temp:  [98.6 F (37 C)-98.9 F (37.2 C)] 98.6 F (37 C) (11/16 0807) Pulse Rate:  [67-90] 73 (11/16 0807) Resp:  [18-22] 18 (11/16 0807) BP: (147-178)/(78-114) 178/91 (11/16 0807) SpO2:  [94 %-100 %] 98 % (11/16 0807) Last BM Date: 10/30/19 General:   Alert, well-developed female in NAD EENT:  Normal hearing, non icteric sclera, conjunctive pink.  Heart:  Regular rate and rhythm;  No lower extremity edema   Pulm: Normal respiratory effort, lungs CTA bilaterally without wheezes or crackles. Abdomen:  Soft, nondistended, nontender.  Normal bowel sounds.          Neurologic:  Alert and  oriented x4;  grossly normal neurologically. Psych:  Pleasant, cooperative.  Normal mood and affect.   Intake/Output from previous day: 11/15 0701 - 11/16 0700 In: 600 [Blood:600] Out: -  Intake/Output this shift: No intake/output data recorded.  Lab Results: Recent Labs    10/30/19 0254 10/30/19 1426 10/31/19 0358  WBC 9.9 12.0* 9.2  HGB 9.7* 12.1 11.9*  HCT 31.9* 36.8 37.0  PLT 282 209 228   BMET Recent Labs    10/30/19 0116 10/31/19 0358  NA 140 143  K 3.9 3.3*   CL 103 104  CO2 24 29  GLUCOSE 142* 94  BUN 10 7*  CREATININE 0.66 0.77  CALCIUM 8.7* 8.8*   LFT Recent Labs    10/30/19 0116  PROT 6.4*  ALBUMIN 3.6  AST 21  ALT 14  ALKPHOS 62  BILITOT 0.7    Dg Chest Portable 1 View  Result Date: 10/30/2019 CLINICAL DATA:  Chest pain EXAM: PORTABLE CHEST 1 VIEW COMPARISON:  None. FINDINGS: The heart size and mediastinal contours are within normal limits. Both lungs are clear. The visualized skeletal structures are unremarkable. IMPRESSION: No active disease. Electronically Signed   By: Inez Catalina M.D.   On: 10/30/2019 01:29   Ct Angio Abd/pel W And/or Wo Contrast  Result Date: 10/30/2019 CLINICAL DATA:  Lower abdominal and mid chest pain beginning last evening. Lower GI bleeding. EXAM: CTA ABDOMEN AND PELVIS WITHOUT AND WITH CONTRAST TECHNIQUE: Multidetector CT imaging of the abdomen and pelvis was performed using the standard protocol during bolus administration of intravenous contrast. Multiplanar reconstructed images and MIPs were obtained and reviewed to evaluate the vascular anatomy. CONTRAST:  125mL OMNIPAQUE IOHEXOL 350 MG/ML SOLN COMPARISON:  None. FINDINGS: VASCULAR Aorta: The descending thoracic aorta is noted to be markedly tortuous but of normal caliber. Normal caliber the abdominal aorta. The abdominal aorta is widely patent without evidence of a hemodynamically significant stenosis. No abdominal aortic dissection or periaortic stranding. Celiac: Widely patent without hemodynamically significant  narrowing. Conventional branching pattern. SMA: Widely patent without a hemodynamically significant narrowing. Conventional branching pattern. Renals: Solitary bilaterally; the bilateral renal arteries are widely patent without hemodynamically significant narrowing. No vessel irregularity to suggest FMD. IMA: Widely patent without hemodynamically significant narrowing. Inflow: The bilateral common and external iliac arteries are tortuous but of  normal caliber and without a hemodynamically significant stenosis. The bilateral internal iliac arteries are of normal caliber and widely patent. Proximal Outflow: The bilateral common and imaged portions of the bilateral deep and superficial femoral arteries are widely patent without hemodynamically significant narrowing. Veins: The IVC and pelvic venous systems are widely patent without a hemodynamically significant narrowing. Review of the MIP images confirms the above findings. _________________________________________________________ NON-VASCULAR Lower chest: Limited visualization of the lower thorax demonstrates minimal subsegmental atelectasis involving the caudal aspect of the lingula and adjacent to the tortuous descending thoracic aorta. No discrete focal airspace opacities. No pleural effusion. Normal heart size.  No pericardial effusion. Hepatobiliary: Mild nodularity of the hepatic contour. Scattered punctate (subcentimeter) hypoattenuating hepatic lesions are too small to adequately characterize. Normal appearance of the gallbladder given degree distention. No radiopaque gallstones. No intra or extrahepatic biliary ductal dilatation. No ascites. Pancreas: Normal appearance of the pancreas. Spleen: Normal appearance of the spleen. Adrenals/Urinary Tract: There is symmetric enhancement and excretion of the bilateral kidneys. No definite renal stones on this postcontrast examination. No discrete renal lesions. No urinary obstruction or perinephric stranding. There is mild thickening of the bilateral adrenal glands without discrete nodule. Normal appearance of the urinary bladder given degree distention. Stomach/Bowel: Rather extensive colonic diverticulosis without evidence of superimposed acute diverticulitis. Normal appearance of the terminal ileum. The appendix is not visualized, however there is no pericecal inflammatory change. No pneumoperitoneum, pneumatosis or portal venous gas. There are no  discrete areas of intraluminal contrast extravasation to identify a source of upper or lower GI bleeding. Lymphatic: No bulky retroperitoneal, mesenteric, pelvic or inguinal lymph adenopathy. Reproductive: Normal appearance of the pelvic organs. No discrete adnexal lesion. No free fluid the pelvic cul-de-sac. Other: Diffuse body wall anasarca. Note is made of a small peri umbilical hernia which contains a portion of nondilated loop of small bowel. Musculoskeletal: No acute or aggressive osseous abnormalities. Mild-to-moderate multilevel lumbar spine DDD. IMPRESSION: 1. No acute findings within the abdomen or pelvis. Specifically, a definitive source of GI bleeding is not identified. 2. Unremarkable CTA of the abdomen and pelvis. 3. Extensive colonic diverticulosis without evidence of superimposed acute diverticulitis. Electronically Signed   By: Sandi Mariscal M.D.   On: 10/30/2019 08:34     Principal Problem:   GI bleed Active Problems:   Hypertension   GERD (gastroesophageal reflux disease)   CHF (congestive heart failure) ([place])     LOS: 0 days   Tye Savoy ,NP 10/31/2019, 11:54 AM   I have discussed the case with the PA, and that is the plan I formulated. I personally interviewed and examined the patient.  CC: Hematochezia  Probable diverticular bleed, reported maroon and black stool, raising some suspicion for upper GI bleed. Seems to have stopped, she is very anxious to know the cause.  EGD and colonoscopy tomorrow.  Risk and benefits were reviewed and she is agreeable.  The benefits and risks of the planned procedure were described in detail with the patient or (when appropriate) their health care proxy.  Risks were outlined as including, but not limited to, bleeding, infection, perforation, adverse medication reaction leading to cardiac or pulmonary decompensation, pancreatitis (if ERCP).  The limitation of incomplete mucosal visualization was also discussed.  No guarantees or  warranties were given.  Patient at increased risk for cardiopulmonary complications of procedure due to medical comorbidities.     Nelida Meuse III Office: [phone]

## 2019-10-31 NOTE — Plan of Care (Signed)

## 2019-10-31 NOTE — Progress Notes (Signed)
Accomplished getting Mrs. Sebo's HCPOA notarized.  It is on her bedside table. I made four copies for her personal use.  Accompanied Mr. Esquer to [employer].  [place] Chaplain Resident

## 2019-10-31 NOTE — Progress Notes (Signed)
RT set up patient home unit CPAP. NO O2 bleed in needed. Patient is able to place herself on when she is ready.

## 2019-10-31 NOTE — Progress Notes (Signed)
  Responded to Spiritual Consult for notary of an AD.  Found a chipper Yolanda Lang who invited me to come in and take a seat.  We talked about her move from [place] to here to nearer her children and relatives.  She is hopeful she will be okay as she is progressing in her care.  We filled out the AD together but notary/volunteers were not available at the time; will follow up with their availability later this afternoon and tomorrow morning.  Offered prayer aloud for Yolanda Lang at her request.  [place] Chaplain Resident

## 2019-10-31 NOTE — Plan of Care (Signed)
  Problem: Education: Goal: Knowledge of General Education information will improve Description: Including pain rating scale, medication(s)/side effects and non-pharmacologic comfort measures Outcome: Progressing   Problem: Health Behavior/Discharge Planning: Goal: Ability to manage health-related needs will improve Outcome: Progressing   Problem: Clinical Measurements: Goal: Ability to maintain clinical measurements within normal limits will improve Outcome: Progressing   Problem: Activity: Goal: Risk for activity intolerance will decrease Outcome: Progressing   Problem: Elimination: Goal: Will not experience complications related to bowel motility Outcome: Progressing   Problem: Pain Managment: Goal: General experience of comfort will improve Outcome: Progressing   Problem: Safety: Goal: Ability to remain free from injury will improve Outcome: Progressing   Problem: Skin Integrity: Goal: Risk for impaired skin integrity will decrease Outcome: Progressing   

## 2019-11-01 ENCOUNTER — Encounter (HOSPITAL_COMMUNITY): Payer: Self-pay | Admitting: *Deleted

## 2019-11-01 ENCOUNTER — Encounter (HOSPITAL_COMMUNITY)
Admission: EM | Disposition: A | Discharge status: Home / Self Care | Payer: Self-pay | Source: Home / Self Care | Attending: Internal Medicine

## 2019-11-01 ENCOUNTER — Inpatient Hospital Stay (HOSPITAL_COMMUNITY): Payer: Medicare Other | Admitting: Certified Registered Nurse Anesthetist

## 2019-11-01 DIAGNOSIS — K5791 Diverticulosis of intestine, part unspecified, without perforation or abscess with bleeding: Secondary | ICD-10-CM

## 2019-11-01 DIAGNOSIS — R194 Change in bowel habit: Secondary | ICD-10-CM

## 2019-11-01 DIAGNOSIS — K5731 Diverticulosis of large intestine without perforation or abscess with bleeding: Secondary | ICD-10-CM

## 2019-11-01 DIAGNOSIS — D128 Benign neoplasm of rectum: Secondary | ICD-10-CM

## 2019-11-01 HISTORY — PX: BIOPSY: SHX5522

## 2019-11-01 HISTORY — PX: POLYPECTOMY: SHX5525

## 2019-11-01 HISTORY — PX: ESOPHAGOGASTRODUODENOSCOPY (EGD) WITH PROPOFOL: SHX5813

## 2019-11-01 HISTORY — PX: COLONOSCOPY WITH PROPOFOL: SHX5780

## 2019-11-01 LAB — CBC
HCT: 41.4 % (ref 36.0–46.0)
Hemoglobin: 13.9 g/dL (ref 12.0–15.0)
MCH: 29.6 pg (ref 26.0–34.0)
MCHC: 33.6 g/dL (ref 30.0–36.0)
MCV: 88.3 fL (ref 80.0–100.0)
Platelets: 270 10*3/uL (ref 150–400)
RBC: 4.69 MIL/uL (ref 3.87–5.11)
RDW: 14.1 % (ref 11.5–15.5)
WBC: 11.5 10*3/uL — ABNORMAL HIGH (ref 4.0–10.5)
nRBC: 0 % (ref 0.0–0.2)

## 2019-11-01 LAB — BASIC METABOLIC PANEL
Anion gap: 18 — ABNORMAL HIGH (ref 5–15)
BUN: 6 mg/dL — ABNORMAL LOW (ref 8–23)
CO2: 25 mmol/L (ref 22–32)
Calcium: 9.8 mg/dL (ref 8.9–10.3)
Chloride: 100 mmol/L (ref 98–111)
Creatinine, Ser: 0.84 mg/dL (ref 0.44–1.00)
GFR calc Af Amer: >60 mL/min (ref >60)
GFR calc non Af Amer: >60 mL/min (ref >60)
Glucose, Bld: 111 mg/dL — ABNORMAL HIGH (ref 70–99)
Potassium: 3.2 mmol/L — ABNORMAL LOW (ref 3.5–5.1)
Sodium: 143 mmol/L (ref 135–145)

## 2019-11-01 LAB — MRSA PCR SCREENING: MRSA by PCR: NEGATIVE

## 2019-11-01 SURGERY — COLONOSCOPY WITH PROPOFOL
Anesthesia: Monitor Anesthesia Care

## 2019-11-01 MED ORDER — PHENYLEPHRINE HCL (PRESSORS) 10 MG/ML IV SOLN
INTRAVENOUS | Status: DC | PRN
  Administered 2019-11-01: 80 ug via INTRAVENOUS
  Administered 2019-11-01: 120 ug via INTRAVENOUS
  Administered 2019-11-01 (×2): 80 ug via INTRAVENOUS

## 2019-11-01 MED ORDER — EPHEDRINE SULFATE 50 MG/ML IJ SOLN
INTRAMUSCULAR | Status: DC | PRN
  Administered 2019-11-01 (×2): 5 mg via INTRAVENOUS

## 2019-11-01 MED ORDER — PROPOFOL 500 MG/50ML IV EMUL
INTRAVENOUS | Status: DC | PRN
  Administered 2019-11-01: 100 ug/kg/min via INTRAVENOUS

## 2019-11-01 MED ORDER — POTASSIUM CHLORIDE CRYS ER 20 MEQ PO TBCR
40.0000 meq | EXTENDED_RELEASE_TABLET | Freq: Two times a day (BID) | ORAL | Status: DC
  Administered 2019-11-01: 40 meq via ORAL
  Filled 2019-11-01: qty 2

## 2019-11-01 MED ORDER — LIDOCAINE 2% (20 MG/ML) 5 ML SYRINGE
INTRAMUSCULAR | Status: DC | PRN
  Administered 2019-11-01: 80 mg via INTRAVENOUS

## 2019-11-01 MED ORDER — PROPOFOL 10 MG/ML IV BOLUS
INTRAVENOUS | Status: DC | PRN
  Administered 2019-11-01: 10 mg via INTRAVENOUS

## 2019-11-01 SURGICAL SUPPLY — 24 items

## 2019-11-01 NOTE — Anesthesia Postprocedure Evaluation (Signed)
Anesthesia Post Note  Patient: Yasmene Salomone  Procedure(s) Performed: COLONOSCOPY WITH PROPOFOL (N/A ) ESOPHAGOGASTRODUODENOSCOPY (EGD) WITH PROPOFOL (N/A ) POLYPECTOMY BIOPSY     Patient location during evaluation: Endoscopy Anesthesia Type: MAC Level of consciousness: awake and alert Pain management: pain level controlled Vital Signs Assessment: post-procedure vital signs reviewed and stable Respiratory status: spontaneous breathing, nonlabored ventilation and respiratory function stable Cardiovascular status: blood pressure returned to baseline and stable Postop Assessment: no apparent nausea or vomiting Anesthetic complications: no    Last Vitals:  Vitals:   11/01/19 0903 11/01/19 0913  BP: 139/72 (!) 129/56  Pulse: 78 71  Resp: 20 19  Temp:    SpO2: 97% 96%    Last Pain:  Vitals:   11/01/19 0852  TempSrc:   PainSc: 0-No pain                 Lidia Collum

## 2019-11-01 NOTE — Op Note (Signed)
[facility] Patient Name: Yolanda Lang Procedure Date : 11/01/2019 MRN: [id number] Attending MD: Estill Cotta. Loletha Carrow , MD Date of Birth: 10-23-1946 CSN: [id number] Age: 73 Admit Type: Inpatient Procedure:                Colonoscopy Indications:              Hematochezia (maroon and black stool) Providers:                Estill Cotta. Loletha Carrow, MD, William Dalton, Technician,                            Ashley Jacobs, RN Referring MD:              Medicines:                Monitored Anesthesia Care Complications:            No immediate complications. Estimated Blood Loss:     Estimated blood loss was minimal. Procedure:                Pre-Anesthesia Assessment:                           - Prior to the procedure, a History and Physical                            was performed, and patient medications and                            allergies were reviewed. The patient's tolerance of                            previous anesthesia was also reviewed. The risks                            and benefits of the procedure and the sedation                            options and risks were discussed with the patient.                            All questions were answered, and informed consent                            was obtained. Prior Anticoagulants: The patient has                            taken no previous anticoagulant or antiplatelet                            agents. ASA Grade Assessment: III - A patient with                            severe systemic disease. After reviewing the risks  and benefits, the patient was deemed in                            satisfactory condition to undergo the procedure.                           After obtaining informed consent, the colonoscope                            was passed under direct vision. Throughout the                            procedure, the patient's blood pressure, pulse, and                            oxygen  saturations were monitored continuously. The                            [id number]) Olympus colonoscope was                            introduced through the anus and advanced to the the                            cecum, identified by appendiceal orifice and                            ileocecal valve. The colonoscopy was performed with                            difficulty due to poor bowel prep, a redundant                            colon and significant looping. Successful                            completion of the procedure was aided by changing                            the patient to a supine position and using manual                            pressure. The patient tolerated the procedure                            fairly well. The quality of the bowel preparation                            was fair, lavage performed. The ileocecal valve,                            appendiceal orifice, and rectum were photographed. Scope In: 8:13:41 AM Scope Out: 8:38:59 AM Scope Withdrawal Time: 0 hours 11 minutes 43 seconds  Total Procedure Duration: 0 hours 25 minutes 18 seconds  Findings:      The digital rectal exam findings include decreased sphincter tone.      Many large-mouthed diverticula were found in the entire colon.      Normal mucosa was found in the entire colon. Biopsies for histology were       taken with a cold forceps from the sigmoid colon for evaluation of       microscopic colitis. (patient reported several months of intermittent       urgency and loose stool with Hx IBS)      A 6 mm polyp was found in the proximal rectum. The polyp was       pedunculated. The polyp was removed with a hot snare. Resection and       retrieval were complete.      The exam was otherwise without abnormality on direct and retroflexion       views. Impression:               - Preparation of the colon was fair.                           - Decreased sphincter tone found on digital rectal                             exam.                           - Diverticulosis in the entire examined colon.                           - Normal mucosa in the entire examined colon.                            Biopsied.                           - One 6 mm polyp in the proximal rectum, removed                            with a hot snare. Resected and retrieved.                           - The examination was otherwise normal on direct                            and retroflexion views.                           Suspected diverticular bleeding that has resolved. Recommendation:           - Return patient to hospital ward for possible                            discharge same day.                           - Resume regular diet.                           -  See the other procedure note for documentation of                            additional recommendations.                           - Patient will be contacted about pathology results                            and any necessary office follow up after discharge.                           - Based on current guidelines, no repeat routine                            surveillance colonoscopy due to age. Procedure Code(s):        --- Professional ---                           [phone], Colonoscopy, flexible; with removal of                            tumor(s), polyp(s), or other lesion(s) by snare                            technique                           [id number], 83, Colonoscopy, flexible; with biopsy,                            single or multiple Diagnosis Code(s):        --- Professional ---                           K62.89, Other specified diseases of anus and rectum                           K62.1, Rectal polyp                           K92.1, Melena (includes Hematochezia)                           K57.30, Diverticulosis of large intestine without                            perforation or abscess without bleeding CPT copyright 2019 American  Medical Association. All rights reserved. The codes documented in this report are preliminary and upon coder review may  be revised to meet current compliance requirements. Flemon Kelty L. Loletha Carrow, MD 11/01/2019 9:08:37 AM This report has been signed electronically. Number of Addenda: 0

## 2019-11-01 NOTE — Transfer of Care (Signed)
Immediate Anesthesia Transfer of Care Note  Patient: Yolanda Lang  Procedure(s) Performed: COLONOSCOPY WITH PROPOFOL (N/A ) ESOPHAGOGASTRODUODENOSCOPY (EGD) WITH PROPOFOL (N/A ) POLYPECTOMY BIOPSY  Patient Location: Endoscopy Unit  Anesthesia Type:MAC  Level of Consciousness: awake, alert , patient cooperative and responds to stimulation  Airway & Oxygen Therapy: Patient Spontanous Breathing and Patient connected to nasal cannula oxygen  Post-op Assessment: Report given to RN and Post -op Vital signs reviewed and stable  Post vital signs: Reviewed and stable  Last Vitals:  Vitals Value Taken Time  BP 127/82 11/01/19 0853  Temp    Pulse 92 11/01/19 0854  Resp 21 11/01/19 0854  SpO2 100 % 11/01/19 0854  Vitals shown include unvalidated device data.  Last Pain:  Vitals:   11/01/19 0852  TempSrc:   PainSc: 0-No pain         Complications: No apparent anesthesia complications

## 2019-11-01 NOTE — Op Note (Signed)
[facility] Patient Name: Yolanda Lang Procedure Date : 11/01/2019 MRN: [id number] Attending MD: Estill Cotta. Loletha Carrow , MD Date of Birth: November 18, 1946 CSN: [id number] Age: 73 Admit Type: Inpatient Procedure:                Upper GI endoscopy Indications:              Hematochezia (maroon and black stool) Providers:                Estill Cotta. Loletha Carrow, MD, William Dalton, Technician,                            Ashley Jacobs, RN Referring MD:             [facility] Teaching Service Medicines:                Monitored Anesthesia Care Complications:            No immediate complications. Estimated Blood Loss:     Estimated blood loss: none. Procedure:                Pre-Anesthesia Assessment:                           - Prior to the procedure, a History and Physical                            was performed, and patient medications and                            allergies were reviewed. The patient's tolerance of                            previous anesthesia was also reviewed. The risks                            and benefits of the procedure and the sedation                            options and risks were discussed with the patient.                            All questions were answered, and informed consent                            was obtained. Prior Anticoagulants: The patient has                            taken no previous anticoagulant or antiplatelet                            agents. ASA Grade Assessment: III - A patient with                            severe systemic disease. After reviewing the risks  and benefits, the patient was deemed in                            satisfactory condition to undergo the procedure.                           - Prior to the procedure, a History and Physical                            was performed, and patient medications and                            allergies were reviewed. The patient's tolerance of                       previous anesthesia was also reviewed. The risks                            and benefits of the procedure and the sedation                            options and risks were discussed with the patient.                            All questions were answered, and informed consent                            was obtained. Prior Anticoagulants: The patient has                            taken no previous anticoagulant or antiplatelet                            agents. ASA Grade Assessment: III - A patient with                            severe systemic disease. After reviewing the risks                            and benefits, the patient was deemed in                            satisfactory condition to undergo the procedure.                           After obtaining informed consent, the endoscope was                            passed under direct vision. Throughout the                            procedure, the patient's blood pressure, pulse, and  oxygen saturations were monitored continuously. The                            [id number]-[id number] Olympus gastroscope was                            introduced through the mouth, and advanced to the                            second part of duodenum. The upper GI endoscopy was                            accomplished without difficulty. The patient                            tolerated the procedure well. Scope In: Scope Out: Findings:      The esophagus was normal.      The stomach was normal.      The cardia and gastric fundus were normal on retroflexion.      The examined duodenum was normal. Impression:               - Normal esophagus.                           - Normal stomach.                           - Normal examined duodenum.                           - No specimens collected.                           Suspected colonic diverticular bleeding Recommendation:           - Return patient to  hospital ward for possible                            discharge same day.                           - Resume regular diet.                           - Continue present medications.                           - See the other procedure note for documentation of                            additional recommendations. (colonoscopy) Procedure Code(s):        --- Professional ---                           [phone], Esophagogastroduodenoscopy, flexible,                            transoral;  diagnostic, including collection of                            specimen(s) by brushing or washing, when performed                            (separate procedure) Diagnosis Code(s):        --- Professional ---                           K92.1, Melena (includes Hematochezia) CPT copyright 2019 American Medical Association. All rights reserved. The codes documented in this report are preliminary and upon coder review may  be revised to meet current compliance requirements. Jessenia Filippone L. Loletha Carrow, MD 11/01/2019 9:10:48 AM This report has been signed electronically. Number of Addenda: 0

## 2019-11-01 NOTE — Progress Notes (Signed)
Patient discharging home. Discharge instructions explained to patient and she verbalized understanding. Packed all her personal belongings. No further questions or concerns voiced.  

## 2019-11-01 NOTE — Discharge Instructions (Signed)
You were admitted to the hospital for a lower GI bleed.  You were given 2 units of blood due to blood loss.  You underwent a colonoscopy which showed known diverticulosis which was the suspected cause of your bleed. You underwent EGD which was normal.  You were instructed to stop taking aspirin as it increases the risk of bleeding.  Please follow-up with your primary care doctor in 1 week.

## 2019-11-01 NOTE — Discharge Summary (Addendum)
Name: Yolanda Lang MRN: [id number] DOB: [birth year] 73 y.o. PCP: Loretha Brasil, FNP  Date of Admission: 10/30/2019 12:31 AM Date of Discharge:  Attending Physician: Lucious Groves, DO  Discharge Diagnosis: 1. Acute GI bleed, likely due to diverticular hemorrhage  Discharge Medications: Allergies as of 11/01/2019   No Known Allergies      Medication List     STOP taking these medications    aspirin EC 81 MG tablet       TAKE these medications    clotrimazole-betamethasone cream Commonly known as: LOTRISONE Apply 1 application topically 2 (two) times daily as needed (for rash).   donepezil 23 MG Tabs tablet Commonly known as: ARICEPT Take 23 mg by mouth at bedtime.   furosemide 40 MG tablet Commonly known as: LASIX Take 40 mg by mouth daily.   Lomotil 2.5-0.025 MG tablet Generic drug: diphenoxylate-atropine Take 1-2 tablets by mouth 2 (two) times daily as needed for diarrhea or loose stools.   montelukast 10 MG tablet Commonly known as: SINGULAIR Take 10 mg by mouth daily.   pantoprazole 40 MG tablet Commonly known as: PROTONIX Take 40 mg by mouth 2 (two) times daily.   valsartan 320 MG tablet Commonly known as: DIOVAN Take 320 mg by mouth daily.        Disposition and follow-up:   Ms.Yolanda Lang was discharged from [facility] in Stable condition.  At the hospital follow up visit please address:  1.  Acute GI bleed likely due to diverticular hemorrhage, hypertension, CHF:  Patient was hospitalized for transient GI bleed likely due to diverticular hemorrhage.  No further intervention or workup was felt to be needed for her GI bleed by date of discharge.  Follow-up on her mucosal colon biopsy collected for evidence of microscopic colitis.  Her home BP medications were continued at time of discharge.  However, her aspirin was discontinued due to GI bleed.  Outpatient PCP may consider starting on statin given her CV risk factors.  Also  consider starting beta-blocker given her reported CHF.  2.  Labs / imaging needed at time of follow-up:  - CBC (to ensure Hgb is stable) - BMP (had mild hypokalemia and new anion gap on morning of discharge, likely due to bowel prep she received the night before) - Lipid panel (if not already recently collected) to assess ASCVD risk and need for statin, especially now that aspirin has been held  3.  Pending labs/ test needing follow-up: surgical pathology report from colonoscopy (had a mucosal biopsy taken to work up for microscopic colitis as she has been having intermittent urgency and loose stools with h/o IBS)  Follow-up Appointments: Follow-up Information     Daye, Ilsa Iha T, FNP Follow up in 1 week(s).   Specialty: Family Medicine Contact information: [facility] [place] [address] [address] [facility] Course by problem list: 1. Acute GI bleed 2/2 Diverticulosis with hemorrhage: Patient was hospitalized on 11/15 for copious painless rectal bleeding lasting ~8 hours with drop in Hgb from 12.1 -> 9.7 and requiring 2u pRBC.  Had CTA in the ED which showed pandiverticulosis but did not reveal source of bleeding.  She was admitted for inpatient colonoscopy to determine cause of likely lower GI bleed.  Her Hgb stabilized by the next day and remained stable throughout hospital stay.  She also noted dark red-black stools and abdominal pain reminiscent of prior gastric ulcer,  so an endoscopy was planned as well.  She was initially scheduled to receive colonoscopy on 11/16 but did not receive bowel prep the night before.  She received the prep on the night of 11/16 and had both procedures done on 11/17.  Colonoscopy was unremarkable other than known pandiverticulosis and one 51mm polyp in the proximal rectum which was retrieved.  Also had a mucosal biopsy taken to work-up for microscopic colitis as she has recently been having intermittent urgency and loose  stools with h/o IBS.  Endoscopy was normal.  Bleeding was suspected to be most likely due to diverticular hemorrhage.  She was discharged home on 11/17 with no further bleeding and Hgb of 13.9.  2. Hypertension: Home valsartan and furosemide held on admission due to hypotension in setting of GI bleed.  Both were resumed on discharge.  3. Chronic CHF (unspecified): Patient reported recent diagnosis of early CHF but we did not have records available as she recently moved from [place].  Unknown LVEF.  She had a normal BNP in the ED and no signs of volume overload while hospitalized. Her home furosemide was initially held but then resumed as above.  4. Chest pain: On arrival to the ED she was initially complaining of chest pain over the past week.  History was somewhat vague, difficult to characterize as cardiac vs non-cardiac based on description.  However she has had normal troponin x 2 and EKG with no evidence of ischemic changes, making ACS extremely unlikely.  She reported taking aspirin 81mg  daily for cardiac protection and being on aspirin 81mg  for cardiac protection and taking one 325mg  dose of aspirin the day prior to arrival for chest pain.  Her aspirin was discontinued due to GI bleed.  Discharge Vitals:   BP 127/71 (BP Location: Left Arm)    Pulse 71    Temp 97.6 F (36.4 C) (Oral)    Resp 17    Ht 5' (1.524 m)    Wt 97.4 kg    SpO2 99%    BMI 41.93 kg/m   Pertinent Labs, Studies, and Procedures: Component     Latest Ref Rng & Units 10/30/2019 10/30/2019 10/30/2019         1:16 AM  2:54 AM  2:26 PM  WBC     4.0 - 10.5 K/uL 7.4 9.9 12.0 (H)  RBC     3.87 - 5.11 MIL/uL 4.06 3.28 (L) 4.11  Hemoglobin     12.0 - 15.0 g/dL 12.1 9.7 (L) 12.1  HCT     36.0 - 46.0 % 37.7 31.9 (L) 36.8  MCV     80.0 - 100.0 fL 92.9 97.3 89.5  MCH     26.0 - 34.0 pg 29.8 29.6 29.4  MCHC     30.0 - 36.0 g/dL 32.1 30.4 32.9  RDW     11.5 - 15.5 % 13.3 13.3 14.6  Platelets     150 - 400 K/uL 257 282  209  nRBC     0.0 - 0.2 % 0.0 0.0 0.0  Neutrophils     % 56 44 69  NEUT#     1.7 - 7.7 K/uL 4.2 4.3 8.2 (H)  Lymphocytes     % 37 51 26  Lymphocyte #     0.7 - 4.0 K/uL 2.7 5.0 (H) 3.1  Monocytes Relative     % 5 5 5   Monocyte #     0.1 - 1.0 K/uL 0.4 0.5 0.6  Eosinophil     %  1 0 0  Eosinophils Absolute     0.0 - 0.5 K/uL 0.1 0.0 0.0  Basophil     % 1 0 0  Basophils Absolute     0.0 - 0.1 K/uL 0.1 0.0 0.0  Immature Granulocytes     % 0 0 0  Abs Immature Granulocytes     0.00 - 0.07 K/uL 0.02 0.02 0.04   Component     Latest Ref Rng & Units 10/31/2019 11/01/2019           WBC     4.0 - 10.5 K/uL 9.2 11.5 (H)  RBC     3.87 - 5.11 MIL/uL 4.04 4.69  Hemoglobin     12.0 - 15.0 g/dL 11.9 (L) 13.9  HCT     36.0 - 46.0 % 37.0 41.4  MCV     80.0 - 100.0 fL 91.6 88.3  MCH     26.0 - 34.0 pg 29.5 29.6  MCHC     30.0 - 36.0 g/dL 32.2 33.6  RDW     11.5 - 15.5 % 14.9 14.1  Platelets     150 - 400 K/uL 228 270  nRBC     0.0 - 0.2 % 0.0 0.0   Component     Latest Ref Rng & Units 10/30/2019 10/30/2019         1:16 AM  3:16 AM  Troponin I (High Sensitivity)     <18 ng/L 9 10    Component     Latest Ref Rng & Units 10/30/2019  Lipase     11 - 51 U/L 36  B Natriuretic Peptide     0.0 - 100.0 pg/mL 72.1  Troponin I (High Sensitivity)     <18 ng/L 10   Component     Latest Ref Rng & Units 10/30/2019 10/31/2019 11/01/2019  Sodium     135 - 145 mmol/L 140 143 143  Potassium     3.5 - 5.1 mmol/L 3.9 3.3 (L) 3.2 (L)  Chloride     98 - 111 mmol/L 103 104 100  CO2     22 - 32 mmol/L 24 29 25   Glucose     70 - 99 mg/dL 142 (H) 94 111 (H)  BUN     8 - 23 mg/dL 10 7 (L) 6 (L)  Creatinine     0.44 - 1.00 mg/dL 0.66 0.77 0.84  Calcium     8.9 - 10.3 mg/dL 8.7 (L) 8.8 (L) 9.8  Total Protein     6.5 - 8.1 g/dL 6.4 (L)    Albumin     3.5 - 5.0 g/dL 3.6    AST     15 - 41 U/L 21    ALT     0 - 44 U/L 14    Alkaline Phosphatase     38 - 126 U/L 62      Total Bilirubin     0.3 - 1.2 mg/dL 0.7    GFR, Est Non African American     >60 mL/min >60 >60 >60  GFR, Est African American     >60 mL/min >60 >60 >60  Anion gap     5 - 15 13 10 18  (H)   ECG (10/30/2019):  Sinus rhythm Multiple ventricular premature complexes Aberrant complex Probable left atrial enlargement Abnormal R-wave progression, early transition LVH with secondary repolarization abnormality Prolonged QT interval No old tracing to compare  Colonoscopy (11/01/2019): Impression: - Preparation of the colon  was fair. - Decreased sphincter tone found on digital rectal exam. - Diverticulosis in the entire examined colon. - Normal mucosa in the entire examined colon. Biopsied. - One 6 mm polyp in the proximal rectum, removed with a hot snare. Resected and retrieved. - The examination was otherwise normal on direct and retroflexion views. - Suspected diverticular bleeding that has resolved.  Upper endoscopy (11/01/2019): Impression: - Normal esophagus. - Normal stomach. - Normal examined duodenum. - No specimens collected. - Suspected colonic diverticular bleeding    Discharge Instructions: Discharge Instructions     Diet - low sodium heart healthy   Complete by: As directed    Increase activity slowly   Complete by: As directed        Signed: Tonia Ghent, Medical Student 11/01/2019, 11:59 AM   Pager: [phone]   Al Decant, MD 11/01/2019, 2:08 PM Pager: [phone]

## 2019-11-01 NOTE — Progress Notes (Addendum)
   Subjective:  No acute events overnight.  Filled out advanced directive with chaplin yesterday and had HCPOA notarized.  Vitals stable overnight.  Had colonoscopy and endoscopy earlier this morning without complications.  She is feeling well currently.  Objective:  Vital signs in last 24 hours: Vitals:   11/01/19 0852 11/01/19 0903 11/01/19 0913 11/01/19 0952  BP: 127/82 139/72 (!) 129/56 127/71  Pulse: 96 78 71 71  Resp: (!) 23 20 19 17   Temp: (!) 97.3 F (36.3 C)   97.6 F (36.4 C)  TempSrc:    Oral  SpO2: 100% 97% 96% 99%  Weight:      Height:       Weight change:   Intake/Output Summary (Last 24 hours) at 11/01/2019 1101 Last data filed at 11/01/2019 0855 Gross per 24 hour  Intake 700 ml  Output   Net 700 ml    General: awake, alert, sitting comfortably on side of bed in NAD Pulm: lungs clear to auscultation bilaterally, normal work of breathing on room air CV: regular rate and rhythm, no murmurs, rubs or gallups Neuro: A&Ox3; no focal deficits Skin: warm and dry Psych: normal mood and affect     Assessment/Plan:  Principal Problem:   Acute GI bleeding Active Problems:   Hypertension   GERD (gastroesophageal reflux disease)   CHF (congestive heart failure) ([place])   73 y.o. female with PMHx of diverticulosis, CHF, HTN, GERD, gastric ulcer, and IBS who presented to the ED on 10/30/2019 with bright red blood per rectum.   GI bleed 2/2 Diverticulosis:  Bleeding still resolved.  Hgb stable at 13.1.  Had colonoscopy this morning which was unremarkable other than known pandiverticulosis and one 12mm polyp in the proximal rectum which was retrieved.  Also had a mucosal biopsy taken to work up for microscopic colitis as she has been having intermittent urgency and loose stools with h/o IBS.  Endoscopy was also performed and was normal.  Leading diagnosis of cause of GI bleed is diverticular hemorrhage, now resolved. - No further treatment or workup is needed; stable  for discharge today  Hypertension: Home valsartan and furosemide held on admission due to hypotension in setting of GI bleed.  ARB was restarted yesterday given increased BP - Continue home valsartan 320mg  qd and resume home furosemide 40mg  qd on discharge   Chronic CHF (unknown EF): Still no signs of volume overload.  Last LVEF unknown given lack of prior records - Resume home furosemide 40mg  qd prior to discharge - Initiation of beta-blocker eg carvedilol should be considered in outpatient follow-up   GERD: Continue pantoprazole 40mg  BID  Dispo: Anticipated discharge today   LOS: 1 day   Tonia Ghent, Medical Student 11/01/2019, 11:01 AM

## 2019-11-01 NOTE — Anesthesia Preprocedure Evaluation (Addendum)
Anesthesia Evaluation  Patient identified by MRN, date of birth, ID band Patient awake    Reviewed: Allergy & Precautions, NPO status , Patient's Chart, lab work & pertinent test results  History of Anesthesia Complications Negative for: history of anesthetic complications  Airway Mallampati: II  TM Distance: >3 FB Neck ROM: Full    Dental   Pulmonary sleep apnea , former smoker,    Pulmonary exam normal        Cardiovascular hypertension, +CHF (LVEF unknown)  Normal cardiovascular exam     Neuro/Psych negative neurological ROS  negative psych ROS   GI/Hepatic Neg liver ROS, PUD, GERD  ,  Endo/Other  negative endocrine ROS  Renal/GU negative Renal ROS  negative genitourinary   Musculoskeletal negative musculoskeletal ROS (+)   Abdominal   Peds  Hematology negative hematology ROS (+)   Anesthesia Other Findings   Reproductive/Obstetrics                            Anesthesia Physical Anesthesia Plan  ASA: III  Anesthesia Plan: MAC   Post-op Pain Management:    Induction: Intravenous  PONV Risk Score and Plan: Propofol infusion, TIVA and Treatment may vary due to age or medical condition  Airway Management Planned: Natural Airway, Nasal Cannula and Simple Face Mask  Additional Equipment: None  Intra-op Plan:   Post-operative Plan:   Informed Consent: I have reviewed the patients History and Physical, chart, labs and discussed the procedure including the risks, benefits and alternatives for the proposed anesthesia with the patient or authorized representative who has indicated his/her understanding and acceptance.       Plan Discussed with:   Anesthesia Plan Comments:         Anesthesia Quick Evaluation

## 2019-11-01 NOTE — Progress Notes (Signed)
Follow-up visit with Yolanda Lang.  She was sitting up, chipper, eating her omelet.  Spoke of having received a good report and will likely be going home today.  Prayers answered!  De Burrs Chaplain Resident

## 2019-11-02 ENCOUNTER — Encounter (HOSPITAL_COMMUNITY): Payer: Self-pay | Admitting: Gastroenterology

## 2019-11-02 LAB — SURGICAL PATHOLOGY

## 2019-11-14 ENCOUNTER — Encounter: Payer: Self-pay | Admitting: Gastroenterology

## 2020-04-03 ENCOUNTER — Encounter: Payer: Self-pay | Admitting: Gastroenterology

## 2020-05-08 ENCOUNTER — Ambulatory Visit (INDEPENDENT_AMBULATORY_CARE_PROVIDER_SITE_OTHER): Payer: Medicare Other | Admitting: Gastroenterology

## 2020-05-08 ENCOUNTER — Encounter: Payer: Self-pay | Admitting: Gastroenterology

## 2020-05-08 VITALS — BP 150/90 | HR 72 | Ht 59.0 in | Wt 223.1 lb

## 2020-05-08 DIAGNOSIS — K580 Irritable bowel syndrome with diarrhea: Secondary | ICD-10-CM | POA: Diagnosis not present

## 2020-05-08 NOTE — Progress Notes (Signed)
     [facility] Progress Note  Chief Complaint: Chronic diarrhea PCP:  Novella Rob, NP [facility])  Subjective  History: Last seen for inpatient evaluation November 2020 with acute lower GI bleed. Seen by Dr. Benson Norway in consult, then I performed EGD and colonoscopy. No source of bleeding on EGD. Colonoscopy found extensive diverticulosis, 7 mm rectal tubular adenoma, and normal colonic mucosa with fair preparation. Patient had been describing chronic diarrhea with urgency, so biopsies were taken and ruled out microscopic colitis.  Her primary care provider wanted her to see [place] for her chronic diarrhea.  She has had it for decades and has a previous diagnosis of IBS from when she was living in [place].  She has been taking Lomotil as needed since the late 1970s and says it generally works well.  She has had no further GI bleeding since seen by me in the hospital last year.  ROS: Cardiovascular:  no chest pain Respiratory: no dyspnea  The patient's Past Medical, Family and Social History were reviewed and are on file in the EMR.  Objective:  Med list reviewed  Current Outpatient Medications:  .  calcipotriene (DOVONOX) 0.005 % cream, Apply 1 application topically in the morning and at bedtime., Disp: , Rfl:  .  cetirizine (ZYRTEC) 10 MG tablet, Take 10 mg by mouth daily., Disp: , Rfl:  .  clotrimazole-betamethasone (LOTRISONE) cream, Apply 1 application topically 2 (two) times daily as needed (for rash)., Disp: , Rfl:  .  diphenoxylate-atropine (LOMOTIL) 2.5-0.025 MG tablet, Take 1-2 tablets by mouth 2 (two) times daily as needed for diarrhea or loose stools., Disp: , Rfl:  .  donepezil (ARICEPT) 23 MG TABS tablet, Take 23 mg by mouth at bedtime., Disp: , Rfl:  .  furosemide (LASIX) 40 MG tablet, Take 40 mg by mouth daily., Disp: , Rfl:  .  montelukast (SINGULAIR) 10 MG tablet, Take 10 mg by mouth daily., Disp: , Rfl:  .  oxymetazoline (AFRIN) 0.05 % nasal spray, Place 2  sprays into both nostrils 2 (two) times daily., Disp: , Rfl:  .  pantoprazole (PROTONIX) 40 MG tablet, Take 40 mg by mouth 2 (two) times daily., Disp: , Rfl:  .  valsartan (DIOVAN) 320 MG tablet, Take 320 mg by mouth daily., Disp: , Rfl:    Vital signs in last 24 hrs: Vitals:   05/08/20 1419  BP: (!) 150/90  Pulse: 72    Physical Exam  Well-appearing  HEENT: sclera anicteric, oral mucosa moist without lesions  Cardiac: RRR without murmurs, S1S2 heard, no peripheral edema  Pulm: clear to auscultation bilaterally, normal RR and effort noted  Abdomen: soft, no tenderness, with active bowel sounds. No guarding or palpable hepatosplenomegaly.  Labs:   ___________________________________________ Radiologic studies:   ____________________________________________ Other:  : Pathology reports as noted above _____________________________________________ Assessment & Plan  Assessment: Encounter Diagnosis  Name Primary?  . Irritable bowel syndrome with diarrhea Yes   Longstanding stable symptoms, under good control with as needed use of Lomotil.  No microscopic colitis on biopsies last year.  Small adenomatous colon polyp, no recall needed at her age.   Plan: And is feeling well, looks well, can continue treatment plan and see me as needed.   20 minutes were spent on this encounter (including chart review, history/exam, counseling/coordination of care, and documentation)  Nelida Meuse III

## 2020-05-08 NOTE — Patient Instructions (Addendum)
If you are age 76 or older, your body mass index should be between 23-30. Your Body mass index is 45.07 kg/m. If this is out of the aforementioned range listed, please consider follow up with your Primary Care Provider.  If you are age 60 or younger, your body mass index should be between 19-25. Your Body mass index is 45.07 kg/m. If this is out of the aformentioned range listed, please consider follow up with your Primary Care Provider.   It was a pleasure to see you today!  Dr. Loletha Carrow     Food Guidelines for those with chronic digestive trouble:  Many people have difficulty digesting certain foods, causing a variety of distressing and embarrassing symptoms such as abdominal pain, bloating and gas.  These foods may need to be avoided or consumed in small amounts.  Here are some tips that might be helpful for you.  1.   Lactose intolerance is the difficulty or complete inability to digest lactose, the natural sugar in milk and anything made from milk.  This condition is harmless, common, and can begin any time during life.  Some people can digest a modest amount of lactose while others cannot tolerate any.  Also, not all dairy products contain equal amounts of lactose.  For example, hard cheeses such as parmesan have less lactose than soft cheeses such as cheddar.  Yogurt has less lactose than milk or cheese.  Many packaged foods (even many brands of bread) have milk, so read ingredient lists carefully.  It is difficult to test for lactose intolerance, so just try avoiding lactose as much as possible for a week and see what happens with your symptoms.  If you seem to be lactose intolerant, the best plan is to avoid it (but make sure you get calcium from another source).  The next best thing is to use lactase enzyme supplements, available over the counter everywhere.  Just know that many lactose intolerant people need to take several tablets with each serving of dairy to avoid symptoms.  Lastly, a lot  of restaurant food is made with milk or butter.  Many are things you might not suspect, such as mashed potatoes, rice and pasta (cooked with butter) and "grilled" items.  If you are lactose intolerant, it never hurts to ask your server what has milk or butter.  2.   Fiber is an important part of your diet, but not all fiber is well-tolerated.  Insoluble fiber such as bran is often consumed by normal gut bacteria and converted into gas.  Soluble fiber such as oats, squash, carrots and green beans are typically tolerated better.  3.   Some types of carbohydrates can be poorly digested.  Examples include: fructose (apples, cherries, pears, raisins and other dried fruits), fructans (onions, zucchini, large amounts of wheat), sorbitol/mannitol/xylitol and sucralose/Splenda (common artificial sweeteners), and raffinose (lentils, broccoli, cabbage, asparagus, brussel sprouts, many types of beans).  Do a Development worker, community for [employer] and you will find helpful information. Beano, a dietary supplement, will often help with raffinose-containing foods.  As with lactase tablets, you may need several per serving.  4.   Whenever possible, avoid processed food&meats and chemical additives.  High fructose corn syrup, a common sweetener, may be difficult to digest.  Eggs and soy (comes from the soybean, and added to many foods now) are other common bloating/gassy foods.  5.  Regarding gluten:  gluten is a protein mainly found in wheat, but also rye and barley.  There  is a condition called celiac sprue, which is an inflammatory reaction in the small intestine causing a variety of digestive symptoms.  Blood testing is highly reliable to look for this condition, and sometimes upper endoscopy with small bowel biopsies may be necessary to make the diagnosis.  Many patients who test negative for celiac sprue report improvement in their digestive symptoms when they switch to a gluten-free diet.  However, in these "non-celiac gluten  sensitive" patients, the true role of gluten in their symptoms is unclear.  Reducing carbohydrates in general may decrease the gas and bloating caused when gut bacteria consume carbs. Also, some of these patients may actually be intolerant of the baker's yeast in bread products rather than the gluten.  Flatbread and other reduced yeast breads might therefore be tolerated.  There is no specific testing available for most food intolerances, which are discovered mainly by dietary elimination.  Please do not embark on a gluten free diet unless directed by your doctor, as it is highly restrictive, and may lead to nutritional deficiencies if not carefully monitored.  Lastly, beware of internet claims offering "personalized" tests for food intolerances.  Such testing has no reliable scientific evidence to support its reliability and correlation to symptoms.    6.  The best advice is old advice, especially for those with chronic digestive trouble - try to eat "clean".  Balanced diet, avoid processed food, plenty of fruits and vegetables, cut down the sugar, minimal alcohol, avoid tobacco. Make time to care for yourself, get enough sleep, exercise when you can, reduce stress.  Your guts will thank you for it.   - Dr. Herma [facility]

## 2020-08-06 ENCOUNTER — Telehealth: Payer: Self-pay

## 2020-08-06 NOTE — Telephone Encounter (Signed)
NOTES ON FILE FROM [facility] [phone] SENT REFERRAL TO SCHEDULING 

## 2020-09-13 ENCOUNTER — Ambulatory Visit: Payer: Medicare Other | Admitting: Cardiology

## 2020-09-27 ENCOUNTER — Ambulatory Visit: Payer: Medicare Other | Admitting: Cardiology

## 2020-10-01 ENCOUNTER — Encounter: Payer: Self-pay | Admitting: Cardiology

## 2020-10-01 ENCOUNTER — Ambulatory Visit (INDEPENDENT_AMBULATORY_CARE_PROVIDER_SITE_OTHER): Payer: Medicare Other | Admitting: Cardiology

## 2020-10-01 ENCOUNTER — Other Ambulatory Visit: Payer: Self-pay

## 2020-10-01 VITALS — BP 140/88 | HR 66 | Ht 59.0 in | Wt 227.6 lb

## 2020-10-01 DIAGNOSIS — Z7189 Other specified counseling: Secondary | ICD-10-CM | POA: Diagnosis not present

## 2020-10-01 DIAGNOSIS — I10 Essential (primary) hypertension: Secondary | ICD-10-CM | POA: Diagnosis not present

## 2020-10-01 DIAGNOSIS — R002 Palpitations: Secondary | ICD-10-CM | POA: Diagnosis not present

## 2020-10-01 DIAGNOSIS — Z8249 Family history of ischemic heart disease and other diseases of the circulatory system: Secondary | ICD-10-CM | POA: Diagnosis not present

## 2020-10-01 NOTE — Progress Notes (Signed)
Cardiology Office Note:    Date:  10/01/2020   ID:  Yolanda Lang, DOB 11-26-1946, MRN [id number]  PCP:  Sonia Side., FNP  Cardiologist:  Buford Dresser, MD  Referring MD: Sonia Side., FNP   CC: new patient evaluation for possibly history of heart failure  History of Present Illness:    Yolanda Lang is a 74 y.o. female with a hx of hypertension, OSA on BiPAP who is seen as a new consult at the request of Sonia Side., FNP for the evaluation and management of possible history of heart failure.  I unfortunately do not have any records today from her PCP. There are GI notes from earlier in the year, seen for lower GI bleeding and found to have diverticulosis in 2020, re-referred for chronic diarrhea. I do not have any records of her cardiac history, but per the referral comment, she was referred for hypertension, heart failure, and mitral valve prolapse.  Today: Moved to this area from [place] a year ago. Had a cardiologist in [place] for years. Saw Dr. Stark Klein, [phone]. [place]. Unable to find through Care Everywhere. Dr. Stark Klein managed her blood pressure for her.   In 2015, was seeing a pulmonologist, had a sleep study done. Uses BiPAP every night since that time. Was told by her cardiologist that she had the beginnings of CHF. Later told that this may have been inaccurate.   Currently, she gets occasionally heart palpitations. Mild shortness of breath only with exertion. No chest pain, has never been told that she has any blockages. No syncope.  Palpitations are infrequent now, less than once/month. Nonexertional, notes when sitting still. Lasts only briefly. Never gets them when she is lying down. Occasional mild LE edema, which she feels flares with her arthritis.   Family history: mother had COPD. Father had 2 strokes, died of MI at age 13. Sister deceased age 12, had a bad heart. She is the oldest of 10 children, only 6  still alive. Sister (living, 3 years younger) on dialysis, has a bad heart.   Has hypertension, has been on medication for this since 1978. Doesn't check BP at home.   Former smoker, quit >45 years ago. Largely sedentary, but climbs stairs routinely. Used to live on the 6th floor. Tries to walk daily. Eats chicken/fish, avoids salt, eats a lot of vegetables/fruit, on a gluten free diet.   Past Medical History:  Diagnosis Date  . Arthritis   . Cataracts, bilateral   . CHF (congestive heart failure) ([place])   . Decreased vision   . Diverticulosis   . Forgetfulness   . GERD (gastroesophageal reflux disease)   . GI bleed   . Hypertension   . IBS (irritable bowel syndrome)   . MVP (mitral valve prolapse)   . OSA (obstructive sleep apnea)   . Prediabetes   . Psoriasis   . Sleep apnea    BIPAP    Past Surgical History:  Procedure Laterality Date  . APPENDECTOMY    . BIOPSY  11/01/2019   Procedure: BIOPSY;  Surgeon: Doran Stabler, MD;  Location: [place];  Service: Gastroenterology;;  . COLONOSCOPY WITH PROPOFOL N/A 11/01/2019   Procedure: COLONOSCOPY WITH PROPOFOL;  Surgeon: Doran Stabler, MD;  Location: [place];  Service: Gastroenterology;  Laterality: N/A;  . ESOPHAGOGASTRODUODENOSCOPY (EGD) WITH PROPOFOL N/A 11/01/2019   Procedure: ESOPHAGOGASTRODUODENOSCOPY (EGD) WITH PROPOFOL;  Surgeon: Doran Stabler, MD;  [facility];  Service: Gastroenterology;  Laterality: N/A;  . FOOT SURGERY Right    right foot joint removal   . PARTIAL HYSTERECTOMY  2008   partial  . POLYPECTOMY  11/01/2019   Procedure: POLYPECTOMY;  Surgeon: Doran Stabler, MD;  Location: [place];  Service: Gastroenterology;;    Current Medications: Current Outpatient Medications on File Prior to Visit  Medication Sig  . calcipotriene (DOVONOX) 0.005 % cream Apply 1 application topically in the morning and at bedtime.  . cetirizine (ZYRTEC) 10 MG tablet Take 10 mg by mouth  daily.  . clotrimazole-betamethasone (LOTRISONE) cream Apply 1 application topically 2 (two) times daily as needed (for rash).  . diphenoxylate-atropine (LOMOTIL) 2.5-0.025 MG tablet Take 1-2 tablets by mouth 2 (two) times daily as needed for diarrhea or loose stools.  . donepezil (ARICEPT) 23 MG TABS tablet Take 23 mg by mouth at bedtime.  . furosemide (LASIX) 40 MG tablet Take 40 mg by mouth daily.  . montelukast (SINGULAIR) 10 MG tablet Take 10 mg by mouth daily.  Marland Kitchen oxymetazoline (AFRIN) 0.05 % nasal spray Place 2 sprays into both nostrils 2 (two) times daily.  . pantoprazole (PROTONIX) 40 MG tablet Take 40 mg by mouth 2 (two) times daily.  . valsartan (DIOVAN) 320 MG tablet Take 320 mg by mouth daily.   No current facility-administered medications on file prior to visit.     Allergies:   Other   Social History   Tobacco Use  . Smoking status: Former Smoker    Types: Cigarettes    Quit date: 05/08/1985    Years since quitting: 35.4  . Smokeless tobacco: Never Used  . Tobacco comment: quit in 1975  Vaping Use  . Vaping Use: Never used  Substance Use Topics  . Alcohol use: Never  . Drug use: Never    Family History: family history includes COPD in her mother; Diabetes in her maternal grandmother, sister, and sister; Heart attack in her father; Heart disease in her father; Hypertension in her daughter, daughter, father, and mother; Pancreatic disease in her brother; Stroke in her father.  ROS:   Please see the history of present illness.  Additional pertinent ROS: Constitutional: Negative for chills, fever, night sweats, unintentional weight loss  HENT: Negative for ear pain and hearing loss.   Eyes: Negative for loss of vision and eye pain.  Respiratory: Negative for cough, sputum, wheezing.   Cardiovascular: See HPI. Gastrointestinal: Negative for abdominal pain, melena, and hematochezia.  Genitourinary: Negative for dysuria and hematuria.  Musculoskeletal: Negative for  falls and myalgias.  Skin: Negative for itching and rash.  Neurological: Negative for focal weakness, focal sensory changes and loss of consciousness.  Endo/Heme/Allergies: Does not bruise/bleed easily.     EKGs/Labs/Other Studies Reviewed:    The following studies were reviewed today: No prior cardiac studies available  EKG:  EKG is personally reviewed.  The ekg ordered today demonstrates SR at 66 bpm with a PVC. Nonspecific ST-T wave abnormality. Appears in a different pattern than ECG from 10/30/19  Recent Labs: 10/30/2019: ALT 14; B Natriuretic Peptide 72.1 11/01/2019: BUN 6; Creatinine, Ser 0.84; Hemoglobin 13.9; Platelets 270; Potassium 3.2; Sodium 143  Recent Lipid Panel No results found for: CHOL, TRIG, HDL, CHOLHDL, VLDL, LDLCALC, LDLDIRECT  Physical Exam:    VS:  BP 140/88   Pulse 66   Ht 4\' 11"  (1.499 m)   Wt 227 lb 9.6 oz (103.2 kg)   SpO2 91%   BMI 45.97  kg/m     Wt Readings from Last 3 Encounters:  10/01/20 227 lb 9.6 oz (103.2 kg)  05/08/20 223 lb 2 oz (101.2 kg)  11/01/19 214 lb 11.5 oz (97.4 kg)    GEN: Well nourished, well developed in no acute distress HEENT: Normal, moist mucous membranes NECK: No JVD CARDIAC: regular rhythm, normal S1 and S2, no rubs or gallops. No murmurs. VASCULAR: Radial and DP pulses 2+ bilaterally. No carotid bruits RESPIRATORY:  Clear to auscultation without rales, wheezing or rhonchi  ABDOMEN: Soft, non-tender, non-distended MUSCULOSKELETAL:  Ambulates independently SKIN: Warm and dry, no edema NEUROLOGIC:  Alert and oriented x 3. No focal neuro deficits noted. PSYCHIATRIC:  Normal affect    ASSESSMENT:    1. Primary hypertension   2. Heart palpitations   3. Family history of heart disease   4. Cardiac risk counseling   5. Counseling on health promotion and disease prevention    PLAN:    Hypertension: -longstanding, has been on medication since 1978 -slightly elevated today, but reports good control at  home -continue valsartan 320 mg daily, furosemide 40 mg daily -last Cr 0.85  Palpitations: -single PVC seen on ECG -rare symptoms currently. Too infrequent to likely catch on monitor, and too brief to catch on [employer] -she will call if these become more frequent  Possible history of heart failure, mitral valve prolapse: I have no records, but she endorses that she was told in [place] by her prior cardiologist that she had the beginnings of heart failure. She later had a test done and was told that this statement was inaccurate. No clinical heart failure symptoms -no click appreciate on exam -discussed what heart failure is at length, including systolic, diastolic, and right heart failure -discussed signs/symptoms to watch for -if she develops symptoms, would get echocardiogram  Cardiac risk counseling and prevention recommendations: -recommend heart healthy/Mediterranean diet, with whole grains, fruits, vegetable, fish, lean meats, nuts, and olive oil. Limit salt. -recommend moderate walking, 3-5 times/week for 30-50 minutes each session. Aim for at least 150 minutes.week. Goal should be pace of 3 miles/hours, or walking 1.5 miles in 30 minutes -recommend avoidance of tobacco products. Avoid excess alcohol. -would not start aspirin given history of lower GI bleed/diverticulosis -over the age for primary prevention, but could consider lipid screening in the future if it would change management  Plan for follow up: 1 year or sooner as needed  Buford Dresser, MD, PhD [place]  [facility]    Medication Adjustments/Labs and Tests Ordered: Current medicines are reviewed at length with the patient today.  Concerns regarding medicines are outlined above.  Orders Placed This Encounter  Procedures  . EKG 12-Lead   No orders of the defined types were placed in this encounter.   Patient Instructions  Medication Instructions:  Your Physician recommend you continue on  your current medication as directed.    *If you need a refill on your cardiac medications before your next appointment, please call your pharmacy*   Lab Work: None    Testing/Procedures: None   Follow-Up: At [facility], you and your health needs are our priority.  As part of our continuing mission to provide you with exceptional heart care, we have created designated Provider Care Teams.  These Care Teams include your primary Cardiologist (physician) and Advanced Practice Providers (APPs -  Physician Assistants and Nurse Practitioners) who all work together to provide you with the care you need, when you need it.  We recommend signing  up for the patient portal called "MyChart".  Sign up information is provided on this After Visit Summary.  MyChart is used to connect with patients for Virtual Visits (Telemedicine).  Patients are able to view lab/test results, encounter notes, upcoming appointments, etc.  Non-urgent messages can be sent to your provider as well.   To learn more about what you can do with MyChart, go to [email or web address].    Your next appointment:   1 year(s)  The format for your next appointment:   In Person  Provider:   Buford Dresser, MD      Signed, Buford Dresser, MD PhD 10/01/2020 12:43 PM    [place]

## 2020-10-01 NOTE — Patient Instructions (Signed)

## 2021-03-12 ENCOUNTER — Ambulatory Visit: Payer: Medicare Other | Admitting: Cardiology

## 2021-03-18 ENCOUNTER — Telehealth: Payer: Self-pay | Admitting: Cardiology

## 2021-03-18 DIAGNOSIS — R0609 Other forms of dyspnea: Secondary | ICD-10-CM

## 2021-03-18 DIAGNOSIS — R0600 Dyspnea, unspecified: Secondary | ICD-10-CM

## 2021-03-18 NOTE — Telephone Encounter (Signed)
Pt c/o BP issue: STAT if pt c/o blurred vision, one-sided weakness or slurred speech  1. What are your last 5 BP readings? Has not checked  2. Are you having any other symptoms (ex. Dizziness, headache, blurred vision, passed out)? One sided weakness, SOB when walking  3. What is your BP issue? Patient states she has not checked her BP, but did have one sided weakness this morning when walking. She states she also gets SOB when walking, even just from one room to the next. She states she has had the SOB for a couple of months. She is also requesting a stress test prior to her appointment in May.

## 2021-03-18 NOTE — Telephone Encounter (Signed)
Spoke with patient and she ha been having shortness of breath with exertion  Has been going on for couple months but getting worse. Gets shortness of breath walking from one room to another She does have swelling in her legs, goes down "a little bit" at night Takes Lasix 40 mg daily  Does not weigh or check VS at home Legs felt weak today, lasted about 5-10 minutes Feels pressure in chest at times with exertion  Chest pressure and shortness of breath are relieved with stress Increased stress, has had 2 sister pass away within the last 6 months  Appointment scheduled 5/6 Will forward to Dr Harrell Gave for review

## 2021-03-21 NOTE — Telephone Encounter (Signed)
If she has unilateral weakness, she needs to go to ER to be evaluated. We did not discuss stress testing at our recent appointment. I would start with an echocardiogram for the dyspnea on exertion and chest pain. We have to discuss the pros/cons of stress testing legally before we can order, and I am out of the office for the next few weeks. I will try to call to consent over the phone, then we can place lexiscan myoview order.

## 2021-03-29 NOTE — Telephone Encounter (Signed)
Pt updated with MD's plan and state she is no longer having weakness but agreeable to ECHO (orders placed). Pt also informed that Dr. Harrell Gave would possible like to schedule a lexiscan, but would have to go over pros/cons. Pt aware that MD will be out the office for a couple of weeks and ok for a call once she return.

## 2021-04-12 NOTE — Telephone Encounter (Signed)
Pt has an appointment scheduled for 5/9. Will be able to discuss lexiscan at that time.

## 2021-04-19 ENCOUNTER — Ambulatory Visit: Payer: Medicare Other | Admitting: Cardiology

## 2021-04-20 NOTE — Progress Notes (Deleted)
Cardiology Office Note:    Date:  04/20/2021   ID:  Hardie Pulley, DOB 01-17-1946, MRN [id number]  PCP:  Sonia Side., FNP  Cardiologist:  Buford Dresser, MD  Electrophysiologist:  None   Referring MD: Sonia Side., FNP   Chief Complaint: shortness of breath and chest discomfort  History of Present Illness:    Yolanda Lang is a 75 y.o. female with a history of mitral valve prolapse, hypertension, obstructive sleep apnea on BiPAP, GERD, IBS, and former tobacco use (quit over 45 years ago) who is followed by Dr. Harrell Gave and presents today for evaluation of shortness of breath and chest discomfort.  Patient was referred to Dr. Harrell Gave in 09/2020 for further evaluation of possible CHF, mitral valve prolapse, and hypertension. She moved from [place] to the [place] area a year before. She was followed by a Cardiologist (Dr. Stark Klein) for years there who managed her hypertension. Initial diagnosed with CHF in 1978. At some point, she was reportedly told that she had the beginnings of CHF but later was that this may have been inaccurate. Also possible history of mitral valve prolapse. Outside records were not available at this initial visit. At visit with Dr. Harrell Gave she reported occasional palpitations and mild shortness of breath with exertion as well as occasional mild lower extremity edema which she felt flared with her arthritis. No click was heard on exam to suggest MVP. Plan was to get Echo if she developed signs/symptoms of CHF.  Patient called our office on 03/18/2021 with reports of worsening shortness of breath with exertion as well as some chest pressure with exertion at test. Echo was ordered for further evaluation and this appointment was scheduled.   ***  Chest Pain Dyspnea on Exertion  Possible History of CHF - Questionable history of CHF. Patient previously told in [place] that she had the "beginnings of CHF" but was then told at a later time that this may be  inaccurate. No records from this time. - *** - Continue Lasix 40mg  daily. *** - Echo already scheduled for tomorrow.  - Will check BNP and BMET today.  Possible Mitral Valve Prolapse - Questionable history of MVP as well.  - No click on exam. *** - Can also evaluate with Echo tomorrow.  Hypertension - *** - Continue Valsartan 320mg  daily.  Past Medical History:  Diagnosis Date  . Arthritis   . Cataracts, bilateral   . CHF (congestive heart failure) ([place])   . Decreased vision   . Diverticulosis   . Forgetfulness   . GERD (gastroesophageal reflux disease)   . GI bleed   . Hypertension   . IBS (irritable bowel syndrome)   . MVP (mitral valve prolapse)   . OSA (obstructive sleep apnea)   . Prediabetes   . Psoriasis   . Sleep apnea    BIPAP    Past Surgical History:  Procedure Laterality Date  . APPENDECTOMY    . BIOPSY  11/01/2019   Procedure: BIOPSY;  Surgeon: Doran Stabler, MD;  Location: [place];  Service: Gastroenterology;;  . COLONOSCOPY WITH PROPOFOL N/A 11/01/2019   Procedure: COLONOSCOPY WITH PROPOFOL;  Surgeon: Doran Stabler, MD;  Location: [place];  Service: Gastroenterology;  Laterality: N/A;  . ESOPHAGOGASTRODUODENOSCOPY (EGD) WITH PROPOFOL N/A 11/01/2019   Procedure: ESOPHAGOGASTRODUODENOSCOPY (EGD) WITH PROPOFOL;  Surgeon: Doran Stabler, MD;  Location: [place];  Service: Gastroenterology;  Laterality: N/A;  . FOOT SURGERY Right    right foot  joint removal   . PARTIAL HYSTERECTOMY  2008   partial  . POLYPECTOMY  11/01/2019   Procedure: POLYPECTOMY;  Surgeon: Doran Stabler, MD;  Location: [facility] ENDOSCOPY;  Service: Gastroenterology;;    Current Medications: No outpatient medications have been marked as taking for the 04/22/21 encounter (Appointment) with Darreld Mclean, PA-C.     Allergies:   Other   Social History   Socioeconomic History  . Marital status: Married    Spouse name: Not on file  . Number of  children: 5  . Years of education: Not on file  . Highest education level: Not on file  Occupational History  . Occupation: retired  Tobacco Use  . Smoking status: Former Smoker    Types: Cigarettes    Quit date: 05/08/1985    Years since quitting: 35.9  . Smokeless tobacco: Never Used  . Tobacco comment: quit in 1975  Vaping Use  . Vaping Use: Never used  Substance and Sexual Activity  . Alcohol use: Never  . Drug use: Never  . Sexual activity: Not on file  Other Topics Concern  . Not on file  Social History Narrative  . Not on file   Social Determinants of Health   Financial Resource Strain: Not on file  Food Insecurity: Not on file  Transportation Needs: Not on file  Physical Activity: Not on file  Stress: Not on file  Social Connections: Not on file     Family History: The patient's family history includes COPD in her mother; Diabetes in her maternal grandmother, sister, and sister; Heart attack in her father; Heart disease in her father; Hypertension in her daughter, daughter, father, and mother; Pancreatic disease in her brother; Stroke in her father.  ROS:   Please see the history of present illness.     EKGs/Labs/Other Studies Reviewed:    The following studies were reviewed today: None.  EKG:  EKG ordered today. EKG personally reviewed and demonstrates ***.  Recent Labs: No results found for requested labs within last 8760 hours.  Recent Lipid Panel No results found for: CHOL, TRIG, HDL, CHOLHDL, VLDL, LDLCALC, LDLDIRECT  Physical Exam:    Vital Signs: There were no vitals taken for this visit.    Wt Readings from Last 3 Encounters:  10/01/20 227 lb 9.6 oz (103.2 kg)  05/08/20 223 lb 2 oz (101.2 kg)  11/01/19 214 lb 11.5 oz (97.4 kg)     General: 75 y.o. female in no acute distress. HEENT: Normocephalic and atraumatic. Sclera clear. EOMs intact. Neck: Supple. No carotid bruits. No JVD. Heart: *** RRR. Distinct S1 and S2. No murmurs, gallops, or  rubs. Radial and distal pedal pulses 2+ and equal bilaterally. Lungs: No increased work of breathing. Clear to ausculation bilaterally. No wheezes, rhonchi, or rales.  Abdomen: Soft, non-distended, and non-tender to palpation. Bowel sounds present in all 4 quadrants.  MSK: Normal strength and tone for age. *** Extremities: No lower extremity edema.    Skin: Warm and dry. Neuro: Alert and oriented x3. No focal deficits. Psych: Normal affect. Responds appropriately.   Assessment:    No diagnosis found.  Plan:     Disposition: Follow up in ***   Medication Adjustments/Labs and Tests Ordered: Current medicines are reviewed at length with the patient today.  Concerns regarding medicines are outlined above.  No orders of the defined types were placed in this encounter.  No orders of the defined types were placed in this encounter.   There  are no Patient Instructions on file for this visit.   Signed, Darreld Mclean, PA-C  04/20/2021 2:51 PM    [facility]

## 2021-04-22 ENCOUNTER — Ambulatory Visit: Payer: Medicare Other | Admitting: Student

## 2021-04-23 ENCOUNTER — Ambulatory Visit (HOSPITAL_COMMUNITY): Payer: Medicare Other | Attending: Cardiology

## 2021-04-23 ENCOUNTER — Other Ambulatory Visit: Payer: Self-pay

## 2021-04-23 DIAGNOSIS — R0600 Dyspnea, unspecified: Secondary | ICD-10-CM | POA: Insufficient documentation

## 2021-04-23 DIAGNOSIS — R0609 Other forms of dyspnea: Secondary | ICD-10-CM

## 2021-04-23 LAB — ECHOCARDIOGRAM COMPLETE
Area-P 1/2: 3.21 cm2
S' Lateral: 2.25 cm

## 2021-05-01 ENCOUNTER — Other Ambulatory Visit: Payer: Self-pay

## 2021-05-01 ENCOUNTER — Other Ambulatory Visit: Payer: Self-pay | Admitting: Family

## 2021-05-01 ENCOUNTER — Ambulatory Visit
Admission: RE | Admit: 2021-05-01 | Discharge: 2021-05-01 | Disposition: A | Discharge status: Home / Self Care | Payer: Medicare Other | Source: Ambulatory Visit | Attending: Family | Admitting: Family

## 2021-05-01 DIAGNOSIS — R0600 Dyspnea, unspecified: Secondary | ICD-10-CM

## 2021-05-23 ENCOUNTER — Other Ambulatory Visit: Payer: Self-pay

## 2021-05-23 ENCOUNTER — Ambulatory Visit (INDEPENDENT_AMBULATORY_CARE_PROVIDER_SITE_OTHER): Payer: Medicare Other | Admitting: Cardiology

## 2021-05-23 ENCOUNTER — Telehealth: Payer: Self-pay | Admitting: Cardiology

## 2021-05-23 VITALS — BP 156/98 | HR 80 | Ht 60.0 in | Wt 231.8 lb

## 2021-05-23 DIAGNOSIS — R0600 Dyspnea, unspecified: Secondary | ICD-10-CM

## 2021-05-23 DIAGNOSIS — Z01812 Encounter for preprocedural laboratory examination: Secondary | ICD-10-CM

## 2021-05-23 DIAGNOSIS — I10 Essential (primary) hypertension: Secondary | ICD-10-CM

## 2021-05-23 DIAGNOSIS — R002 Palpitations: Secondary | ICD-10-CM

## 2021-05-23 DIAGNOSIS — Z01818 Encounter for other preprocedural examination: Secondary | ICD-10-CM | POA: Diagnosis not present

## 2021-05-23 DIAGNOSIS — R079 Chest pain, unspecified: Secondary | ICD-10-CM

## 2021-05-23 DIAGNOSIS — Z7189 Other specified counseling: Secondary | ICD-10-CM

## 2021-05-23 DIAGNOSIS — I422 Other hypertrophic cardiomyopathy: Secondary | ICD-10-CM | POA: Diagnosis not present

## 2021-05-23 DIAGNOSIS — R0609 Other forms of dyspnea: Secondary | ICD-10-CM

## 2021-05-23 NOTE — Progress Notes (Signed)
Cardiology Office Note:    Date:  05/24/2021   ID:  Yolanda Lang, DOB 1946/10/16, MRN [id number]  PCP:  Yolanda Side., FNP  Cardiologist:  Buford Dresser, MD  Referring MD: Yolanda Side., FNP   CC: follow up of test results  History of Present Illness:    Yolanda Lang is a 75 y.o. female with a hx of hypertension, OSA on BiPAP who is seen for follow up. I initially met her 10/01/2020 as a new consult at the request of Yolanda Side., FNP for the evaluation and management of possible history of heart failure.  CV history: Moved to this area from [place] a year ago. Had a cardiologist in [place] for years. Saw Dr. Stark Klein, [phone]. [place]. Unable to find through Care Everywhere. Dr. Stark Klein managed her blood pressure for her.   In 2015, was seeing a pulmonologist, had a sleep study done. Uses BiPAP every night since that time. Was told by her cardiologist that she had the beginnings of CHF. Later told that this may have been inaccurate.   Family history: mother had COPD. Father had 2 strokes, died of MI at age 11. Sister deceased age 17, had a bad heart. She is the oldest of 10 children, only 6 still alive. Sister (living, 3 years younger) on dialysis, has a bad heart.   Today: Here to discuss echo results. I had discussed with my colleague Dr. Gardiner Rhyme, and given the pattern of her echo she may be a good candidate for stress cardiac MRI. We discussed this with her daughter Yolanda Lang on the phone.   Notes mild deep chest pressure, brief, nonexertional. Happens nearly daily. Upper sternal region, more left than right. Occasional sharp pain as well. No associated symptoms. Dyspnea on exertion as well, especially if rushing.  We reviewed options for further testing as well as general education. See below.  Denies shortness of breath at rest. No PND, orthopnea, LE edema or unexpected weight gain. No syncope or change in palpitations.    Past Medical History:  Diagnosis Date   Arthritis    Cataracts, bilateral    CHF (congestive heart failure) ([facility])    Decreased vision    Diverticulosis    Forgetfulness    GERD (gastroesophageal reflux disease)    GI bleed    Hypertension    IBS (irritable bowel syndrome)    MVP (mitral valve prolapse)    OSA (obstructive sleep apnea)    Prediabetes    Psoriasis    Sleep apnea    BIPAP    Past Surgical History:  Procedure Laterality Date   APPENDECTOMY     BIOPSY  11/01/2019   Procedure: BIOPSY;  Surgeon: Doran Stabler, MD;  Location: [place];  Service: Gastroenterology;;   COLONOSCOPY WITH PROPOFOL N/A 11/01/2019   Procedure: COLONOSCOPY WITH PROPOFOL;  Surgeon: Doran Stabler, MD;  Location: [place];  Service: Gastroenterology;  Laterality: N/A;   ESOPHAGOGASTRODUODENOSCOPY (EGD) WITH PROPOFOL N/A 11/01/2019   Procedure: ESOPHAGOGASTRODUODENOSCOPY (EGD) WITH PROPOFOL;  Surgeon: Doran Stabler, MD;  Location: [place];  Service: Gastroenterology;  Laterality: N/A;   FOOT SURGERY Right    right foot joint removal    PARTIAL HYSTERECTOMY  2008   partial   POLYPECTOMY  11/01/2019   Procedure: POLYPECTOMY;  Surgeon: Doran Stabler, MD;  Location: [place];  Service: Gastroenterology;;    Current Medications: Current Outpatient Medications on File  Prior to Visit  Medication Sig   albuterol (VENTOLIN HFA) 108 (90 Base) MCG/ACT inhaler SMARTSIG:1-2 Puff(s) Via Inhaler Every 6 Hours PRN   budesonide-formoterol (SYMBICORT) 80-4.5 MCG/ACT inhaler SMARTSIG:2 Puff(s) By Mouth Morning-Evening   calcipotriene (DOVONOX) 0.005 % cream Apply 1 application topically in the morning and at bedtime.   cetirizine (ZYRTEC) 10 MG tablet Take 10 mg by mouth daily.   clotrimazole-betamethasone (LOTRISONE) cream Apply 1 application topically 2 (two) times daily as needed (for rash).   diphenoxylate-atropine (LOMOTIL) 2.5-0.025 MG tablet Take 1-2 tablets by  mouth 2 (two) times daily as needed for diarrhea or loose stools.   donepezil (ARICEPT) 23 MG TABS tablet Take 23 mg by mouth at bedtime.   furosemide (LASIX) 40 MG tablet Take 40 mg by mouth daily.   montelukast (SINGULAIR) 10 MG tablet Take 10 mg by mouth daily.   oxymetazoline (AFRIN) 0.05 % nasal spray Place 2 sprays into both nostrils 2 (two) times daily.   pantoprazole (PROTONIX) 40 MG tablet Take 40 mg by mouth 2 (two) times daily.   valsartan (DIOVAN) 320 MG tablet Take 320 mg by mouth daily.   No current facility-administered medications on file prior to visit.     Allergies:   Other   Social History   Tobacco Use   Smoking status: Former    Pack years: 0.00    Types: Cigarettes    Quit date: 05/08/1985    Years since quitting: 36.0   Smokeless tobacco: Never   Tobacco comments:    quit in 1975  Vaping Use   Vaping Use: Never used  Substance Use Topics   Alcohol use: Never   Drug use: Never    Family History: family history includes COPD in her mother; Diabetes in her maternal grandmother, sister, and sister; Heart attack in her father; Heart disease in her father; Hypertension in her daughter, daughter, father, and mother; Pancreatic disease in her brother; Stroke in her father.  ROS:   Please see the history of present illness.  Additional pertinent ROS otherwise unremarkable.  EKGs/Labs/Other Studies Reviewed:    The following studies were reviewed today: Echo 04/23/21 1. Left ventricular ejection fraction, by estimation, is 65 to 70%. The  left ventricle has normal function. The left ventricle has no regional  wall motion abnormalities. There is severe left ventricular hypertrophy.  Left ventricular diastolic parameters   are consistent with Grade II diastolic dysfunction (pseudonormalization).  Elevated left atrial pressure.   2. Right ventricular systolic function is normal. The right ventricular  size is normal. There is moderately elevated pulmonary  artery systolic  pressure. The estimated right ventricular systolic pressure is 58.5 mmHg.   3. The mitral valve is normal in structure. Trivial mitral valve  regurgitation.   4. The aortic valve was not well visualized. Aortic valve regurgitation  is not visualized. No aortic stenosis is present.   5. Aortic dilatation noted. There is mild dilatation of the ascending  aorta, measuring 38 mm.   6. Left atrial size was mildly dilated.   7. The inferior vena cava is normal in size with greater than 50%  respiratory variability, suggesting right atrial pressure of 3 mmHg.   8. Severe LVH, most pronounced at the apex with cavity obliteration in  systole. 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 Recommend  cardiac MRI for further evaluation  EKG:  EKG is personally reviewed.  The ekg ordered 10/01/20 demonstrates SR at 66 bpm with a PVC. Nonspecific ST-T wave abnormality. Appears in  a different pattern than ECG from 10/30/19  Recent Labs: 05/23/2021: BUN 9; Creatinine, Ser 0.84; Hemoglobin WILL FOLLOW; Platelets WILL FOLLOW; Potassium 4.4; Sodium 142  Recent Lipid Panel No results found for: CHOL, TRIG, HDL, CHOLHDL, VLDL, LDLCALC, LDLDIRECT  Physical Exam:    VS:  BP (!) 156/98   Pulse 80   Ht 5' (1.524 m)   Wt 231 lb 12.8 oz (105.1 kg)   SpO2 94%   BMI 45.27 kg/m     Wt Readings from Last 3 Encounters:  05/23/21 231 lb 12.8 oz (105.1 kg)  10/01/20 227 lb 9.6 oz (103.2 kg)  05/08/20 223 lb 2 oz (101.2 kg)    GEN: Well nourished, well developed in no acute distress HEENT: Normal, moist mucous membranes NECK: No JVD CARDIAC: regular rhythm, normal S1 and S2, no rubs or gallops. No murmurs, even with valsalva VASCULAR: Radial and DP pulses 2+ bilaterally. No carotid bruits RESPIRATORY:  Clear to auscultation without rales, wheezing or rhonchi  ABDOMEN: Soft, non-tender, non-distended MUSCULOSKELETAL:  Ambulates independently SKIN: Warm and dry, no edema NEUROLOGIC:   Alert and oriented x 3. No focal neuro deficits noted. PSYCHIATRIC:  Normal affect    ASSESSMENT:    1. DOE (dyspnea on exertion)   2. Chest pain of uncertain etiology   3. Hypertrophic cardiomyopathy ([place])   4. Pre-procedure lab exam   5. Cardiac risk counseling   6. Primary hypertension   7. 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 After shared decision making, she is amenable to stress cMRI. She has tolerated MRI in the past, and she has also had regadenason stress tests in the past without issue. -we discussed risks/benefits of MRI and how it is done, all questions answered.  Hypertension: -longstanding, has been on medication since 1978 -elevated today, but reports good control at home -continue valsartan 320 mg daily, furosemide 40 mg daily -last Cr 0.85  Palpitations: -single PVC seen on ECG -rare symptoms currently. Too infrequent to likely catch on monitor, and too brief to catch on [employer]  Cardiac risk counseling and prevention recommendations: -recommend heart healthy/Mediterranean diet, with whole grains, fruits, vegetable, fish, lean meats, nuts, and olive oil. Limit salt. -recommend moderate walking, 3-5 times/week for 30-50 minutes each session. Aim for at least 150 minutes.week. Goal should be pace of 3 miles/hours, or walking 1.5 miles in 30 minutes -recommend avoidance of tobacco products. Avoid excess alcohol. -would not start aspirin given history of lower GI bleed/diverticulosis -over the age for primary prevention, but could consider lipid screening in the future if it would change management  Plan for follow up: to be determined based on results of the MRI  Buford Dresser, MD, PhD [facility]  [facility]    Medication Adjustments/Labs and Tests Ordered: Current medicines are reviewed at length with the Lang today.   Concerns regarding medicines are outlined above.  Orders Placed This Encounter  Procedures   MR CARDIAC STRESS TEST   Basic metabolic panel   CBC    No orders of the defined types were placed in this encounter.   Lang Instructions  Medication Instructions:  Your Physician recommend you continue on your current medication as directed.    *If you need a refill on your cardiac medications before your next appointment, please call your pharmacy*   Lab Work: Your physician recommends lab work today (CBC, BMP).   If  you have labs (blood work) drawn today and your tests are completely normal, you will receive your results only by: [place] (if you have MyChart) OR A paper copy in the mail If you have any lab test that is abnormal or we need to change your treatment, we will call you to review the results.   Testing/Procedures: Cardiac Stress MRI @ [facility]     Follow-Up: At [facility], you and your health needs are our priority.  As part of our continuing mission to provide you with exceptional heart care, we have created designated Provider Care Teams.  These Care Teams include your primary Cardiologist (physician) and Advanced Practice Providers (APPs -  Physician Assistants and Nurse Practitioners) who all work together to provide you with the care you need, when you need it.  We recommend signing up for the Lang portal called "MyChart".  Sign up information is provided on this After Visit Summary.  MyChart is used to connect with patients for Virtual Visits (Telemedicine).  Patients are able to view lab/test results, encounter notes, upcoming appointments, etc.  Non-urgent messages can be sent to your provider as well.   To learn more about what you can do with MyChart, go to [email or web address].    Your next appointment:   Based on test results  The format for your next appointment:   In Person @ [address] [place], [place]  [address]   Provider:   Buford Dresser, MD     Signed, Buford Dresser, MD PhD 05/24/2021 11:08 AM    [facility]

## 2021-05-23 NOTE — Patient Instructions (Signed)
Medication Instructions:  Your Physician recommend you continue on your current medication as directed.    *If you need a refill on your cardiac medications before your next appointment, please call your pharmacy*   Lab Work: Your physician recommends lab work today (CBC, BMP).   If you have labs (blood work) drawn today and your tests are completely normal, you will receive your results only by: [place] (if you have MyChart) OR A paper copy in the mail If you have any lab test that is abnormal or we need to change your treatment, we will call you to review the results.   Testing/Procedures: Cardiac Stress MRI @ [facility]     Follow-Up: At [facility], you and your health needs are our priority.  As part of our continuing mission to provide you with exceptional heart care, we have created designated Provider Care Teams.  These Care Teams include your primary Cardiologist (physician) and Advanced Practice Providers (APPs -  Physician Assistants and Nurse Practitioners) who all work together to provide you with the care you need, when you need it.  We recommend signing up for the patient portal called "MyChart".  Sign up information is provided on this After Visit Summary.  MyChart is used to connect with patients for Virtual Visits (Telemedicine).  Patients are able to view lab/test results, encounter notes, upcoming appointments, etc.  Non-urgent messages can be sent to your provider as well.   To learn more about what you can do with MyChart, go to [email or web address].    Your next appointment:   Based on test results  The format for your next appointment:   In Person @ [address] [place], [place] [address]   Provider:   Buford Dresser, MD

## 2021-05-23 NOTE — Telephone Encounter (Signed)
Spoke with patient regarding the preferred weekdays and times for scheduling the Cardiac Stress ordered by Dr. Harrell Gave.  Informed patient as soon as we hear regarding the insurance pre certification--we will call with the appointment information.  Patient voiced her understanding.Yolanda Lang

## 2021-05-24 ENCOUNTER — Ambulatory Visit (INDEPENDENT_AMBULATORY_CARE_PROVIDER_SITE_OTHER): Payer: Medicare Other | Admitting: Gastroenterology

## 2021-05-24 ENCOUNTER — Encounter: Payer: Self-pay | Admitting: Gastroenterology

## 2021-05-24 ENCOUNTER — Encounter: Payer: Self-pay | Admitting: Cardiology

## 2021-05-24 VITALS — BP 160/89 | HR 89 | Ht 60.0 in | Wt 229.0 lb

## 2021-05-24 DIAGNOSIS — R140 Abdominal distension (gaseous): Secondary | ICD-10-CM

## 2021-05-24 DIAGNOSIS — K580 Irritable bowel syndrome with diarrhea: Secondary | ICD-10-CM | POA: Diagnosis not present

## 2021-05-24 LAB — CBC
Hematocrit: 41.9 % (ref 34.0–46.6)
Hemoglobin: 13.9 g/dL (ref 11.1–15.9)
MCH: 29.6 pg (ref 26.6–33.0)
MCHC: 33.2 g/dL (ref 31.5–35.7)
MCV: 89 fL (ref 79–97)
Platelets: 292 10*3/uL (ref 150–450)
RBC: 4.7 x10E6/uL (ref 3.77–5.28)
RDW: 13.2 % (ref 11.7–15.4)
WBC: 7.4 10*3/uL (ref 3.4–10.8)

## 2021-05-24 LAB — BASIC METABOLIC PANEL
BUN/Creatinine Ratio: 11 — ABNORMAL LOW (ref 12–28)
BUN: 9 mg/dL (ref 8–27)
CO2: 26 mmol/L (ref 20–29)
Calcium: 10 mg/dL (ref 8.7–10.3)
Chloride: 99 mmol/L (ref 96–106)
Creatinine, Ser: 0.84 mg/dL (ref 0.57–1.00)
Glucose: 93 mg/dL (ref 65–99)
Potassium: 4.4 mmol/L (ref 3.5–5.2)
Sodium: 142 mmol/L (ref 134–144)
eGFR: 72 mL/min/{1.73_m2} (ref >59)

## 2021-05-24 NOTE — Progress Notes (Signed)
[facility] Progress Note  Chief Complaint: IBS-D  Subjective  History: Yolanda Lang was last seen in clinic May 2021 for longstanding IBS.  She reported having this problem for decades, and says it was under good control on Lomotil, so that medicine was continued.  She was seen at that time because her PCP recommended that we manage the condition.  I had seen her during a November 2020 hospitalization for lower GI bleeding (probably diverticular), and because of her history of diarrhea, biopsies were taken and negative for microscopic colitis.  And followed up with me again at the request of her PCP.  She has noticed some triggers for her symptoms such as stress or perhaps certain foods.  She has bloating and gassiness.  Occasionally there is a brief crampy right-sided pain which might herald the need for an urgent bowel movement.  She feels that overall her symptoms have not changed in many years.  If she has at least 2 loose BMs in a row 1 day, she will take the Lomotil, and says it generally works well.  She takes it on average twice a week.  The rest of the time her bowel habits are formed and she feels well.  ROS: Cardiovascular:  no chest pain Respiratory: no dyspnea  The patient's Past Medical, Family and Social History were reviewed and are on file in the EMR.  Objective:  Med list reviewed  Current Outpatient Medications:    albuterol (VENTOLIN HFA) 108 (90 Base) MCG/ACT inhaler, SMARTSIG:1-2 Puff(s) Via Inhaler Every 6 Hours PRN, Disp: , Rfl:    budesonide-formoterol (SYMBICORT) 80-4.5 MCG/ACT inhaler, SMARTSIG:2 Puff(s) By Mouth Morning-Evening, Disp: , Rfl:    calcipotriene (DOVONOX) 0.005 % cream, Apply 1 application topically in the morning and at bedtime., Disp: , Rfl:    cetirizine (ZYRTEC) 10 MG tablet, Take 10 mg by mouth daily., Disp: , Rfl:    clotrimazole-betamethasone (LOTRISONE) cream, Apply 1 application topically 2 (two) times daily as needed (for rash)., Disp: ,  Rfl:    diphenoxylate-atropine (LOMOTIL) 2.5-0.025 MG tablet, Take 1-2 tablets by mouth 2 (two) times daily as needed for diarrhea or loose stools., Disp: , Rfl:    donepezil (ARICEPT) 23 MG TABS tablet, Take 23 mg by mouth at bedtime., Disp: , Rfl:    furosemide (LASIX) 40 MG tablet, Take 40 mg by mouth daily., Disp: , Rfl:    montelukast (SINGULAIR) 10 MG tablet, Take 10 mg by mouth daily., Disp: , Rfl:    oxymetazoline (AFRIN) 0.05 % nasal spray, Place 2 sprays into both nostrils 2 (two) times daily., Disp: , Rfl:    pantoprazole (PROTONIX) 40 MG tablet, Take 40 mg by mouth 2 (two) times daily., Disp: , Rfl:    valsartan (DIOVAN) 320 MG tablet, Take 320 mg by mouth daily., Disp: , Rfl:    Vital signs in last 24 hrs: Vitals:   05/24/21 1357  BP: (!) 160/89  Pulse: 89   Wt Readings from Last 3 Encounters:  05/24/21 229 lb (103.9 kg)  05/23/21 231 lb 12.8 oz (105.1 kg)  10/01/20 227 lb 9.6 oz (103.2 kg)    Physical Exam  Well-appearing HEENT: sclera anicteric, oral mucosa moist without lesions Neck: supple, no thyromegaly, JVD or lymphadenopathy Cardiac: RRR without murmurs, S1S2 heard, no peripheral edema Pulm: clear to auscultation bilaterally, normal RR and effort noted Abdomen: soft, no tenderness, with active bowel sounds. No guarding or palpable hepatosplenomegaly.  (Limited by body habitus) Skin; warm and dry,  no jaundice or rash  Labs:   ___________________________________________ Radiologic studies:   ____________________________________________ Other:   _____________________________________________ Assessment & Plan  Assessment: Encounter Diagnoses  Name Primary?   Irritable bowel syndrome with diarrhea Yes   Abdominal bloating    Chronic stable IBS symptoms with bloating and gas, could be some maldigestion.  Previously ruled out microscopic colitis.  No risk factors for SIBO, and her symptoms have reportedly been stable for decades.  We discussed the  variable nature of this condition and some of the common triggers. Plan: Refill Lomotil when needed. Written dietary advice given regarding bloating and gas See me in a year or sooner as needed.   20 minutes were spent on this encounter (including chart review, history/exam, counseling/coordination of care, and documentation) > 50% of that time was spent on counseling and coordination of care.  Topics discussed included: See above.  Nelida Meuse III

## 2021-05-24 NOTE — Patient Instructions (Signed)
If you are age 77 or older, your body mass index should be between 23-30. Your Body mass index is 44.72 kg/m. If this is out of the aforementioned range listed, please consider follow up with your Primary Care Provider.  If you are age 75 or younger, your body mass index should be between 19-25. Your Body mass index is 44.72 kg/m. If this is out of the aformentioned range listed, please consider follow up with your Primary Care Provider.   __________________________________________________________  The [facility] providers would like to encourage you to use [facility] to communicate with providers for non-urgent requests or questions.  Due to long hold times on the telephone, sending your provider a message by [facility] may be a faster and more efficient way to get a response.  Please allow 48 business hours for a response.  Please remember that this is for non-urgent requests.   It was a pleasure to see you today!  Thank you for trusting me with your gastrointestinal care!      Food Guidelines for those with chronic digestive trouble:  Many people have difficulty digesting certain foods, causing a variety of distressing and embarrassing symptoms such as abdominal pain, bloating and gas.  These foods may need to be avoided or consumed in small amounts.  Here are some tips that might be helpful for you.  1.   Lactose intolerance is the difficulty or complete inability to digest lactose, the natural sugar in milk and anything made from milk.  This condition is harmless, common, and can begin any time during life.  Some people can digest a modest amount of lactose while others cannot tolerate any.  Also, not all dairy products contain equal amounts of lactose.  For example, hard cheeses such as parmesan have less lactose than soft cheeses such as cheddar.  Yogurt has less lactose than milk or cheese.  Many packaged foods (even many brands of bread) have milk, so read ingredient lists carefully.  It is  difficult to test for lactose intolerance, so just try avoiding lactose as much as possible for a week and see what happens with your symptoms.  If you seem to be lactose intolerant, the best plan is to avoid it (but make sure you get calcium from another source).  The next best thing is to use lactase enzyme supplements, available over the counter everywhere.  Just know that many lactose intolerant people need to take several tablets with each serving of dairy to avoid symptoms.  Lastly, a lot of restaurant food is made with milk or butter.  Many are things you might not suspect, such as mashed potatoes, rice and pasta (cooked with butter) and "grilled" items.  If you are lactose intolerant, it never hurts to ask your server what has milk or butter.  2.   Fiber is an important part of your diet, but not all fiber is well-tolerated.  Insoluble fiber such as bran is often consumed by normal gut bacteria and converted into gas.  Soluble fiber such as oats, squash, carrots and green beans are typically tolerated better.  3.   Some types of carbohydrates can be poorly digested.  Examples include: fructose (apples, cherries, pears, raisins and other dried fruits), fructans (onions, zucchini, large amounts of wheat), sorbitol/mannitol/xylitol and sucralose/Splenda (common artificial sweeteners), and raffinose (lentils, broccoli, cabbage, asparagus, brussel sprouts, many types of beans).  Do a Development worker, community for [employer] and you will find helpful information. Beano, a dietary supplement, will often help  with raffinose-containing foods.  As with lactase tablets, you may need several per serving.  4.   Whenever possible, avoid processed food&meats and chemical additives.  High fructose corn syrup, a common sweetener, may be difficult to digest.  Eggs and soy (comes from the soybean, and added to many foods now) are other common bloating/gassy foods.  5.  Regarding gluten:  gluten is a protein mainly found in wheat,  but also rye and barley.  There is a condition called celiac sprue, which is an inflammatory reaction in the small intestine causing a variety of digestive symptoms.  Blood testing is highly reliable to look for this condition, and sometimes upper endoscopy with small bowel biopsies may be necessary to make the diagnosis.  Many patients who test negative for celiac sprue report improvement in their digestive symptoms when they switch to a gluten-free diet.  However, in these "non-celiac gluten sensitive" patients, the true role of gluten in their symptoms is unclear.  Reducing carbohydrates in general may decrease the gas and bloating caused when gut bacteria consume carbs. Also, some of these patients may actually be intolerant of the baker's yeast in bread products rather than the gluten.  Flatbread and other reduced yeast breads might therefore be tolerated.  There is no specific testing available for most food intolerances, which are discovered mainly by dietary elimination.  Please do not embark on a gluten free diet unless directed by your doctor, as it is highly restrictive, and may lead to nutritional deficiencies if not carefully monitored.  Lastly, beware of internet claims offering "personalized" tests for food intolerances.  Such testing has no reliable scientific evidence to support its reliability and correlation to symptoms.    6.  The best advice is old advice, especially for those with chronic digestive trouble - try to eat "clean".  Balanced diet, avoid processed food, plenty of fruits and vegetables, cut down the sugar, minimal alcohol, avoid tobacco. Make time to care for yourself, get enough sleep, exercise when you can, reduce stress.  Your guts will thank you for it.   - Dr. [facility]

## 2021-05-30 ENCOUNTER — Encounter: Payer: Self-pay | Admitting: Cardiology

## 2021-05-30 NOTE — Telephone Encounter (Signed)
Spoke with patient regarding the Friday 06/14/21 12:00 pm Cardiac Stress test at [facility]---arrival time is 11:30 am--1st floor admissions office for check in---will mail information to patient and she voiced her understanding.

## 2021-06-10 ENCOUNTER — Other Ambulatory Visit (HOSPITAL_COMMUNITY): Payer: Self-pay | Admitting: *Deleted

## 2021-06-10 DIAGNOSIS — R0609 Other forms of dyspnea: Secondary | ICD-10-CM

## 2021-06-11 ENCOUNTER — Telehealth (HOSPITAL_COMMUNITY): Payer: Self-pay | Admitting: *Deleted

## 2021-06-11 NOTE — Telephone Encounter (Signed)
Reaching out to patient to offer assistance regarding upcoming cardiac imaging study; pt verbalizes understanding of appt date/time, parking situation and where to check in, and verified current allergies; name and call back number provided for further questions should they arise  Gordy Clement RN Navigator Cardiac Imaging [facility] [phone] office [phone] cell  Pt aware to arrive by 11:15am for pre-stress MRI EKG.

## 2021-06-14 ENCOUNTER — Encounter: Payer: Self-pay | Admitting: Cardiology

## 2021-06-14 ENCOUNTER — Ambulatory Visit (HOSPITAL_COMMUNITY)
Admission: RE | Admit: 2021-06-14 | Discharge: 2021-06-14 | Disposition: A | Discharge status: Home / Self Care | Payer: Medicare Other | Source: Ambulatory Visit | Attending: Cardiology | Admitting: Cardiology

## 2021-06-14 ENCOUNTER — Other Ambulatory Visit: Payer: Self-pay

## 2021-06-14 DIAGNOSIS — R0609 Other forms of dyspnea: Secondary | ICD-10-CM

## 2021-06-14 DIAGNOSIS — R0600 Dyspnea, unspecified: Secondary | ICD-10-CM | POA: Insufficient documentation

## 2021-06-14 DIAGNOSIS — R079 Chest pain, unspecified: Secondary | ICD-10-CM | POA: Insufficient documentation

## 2021-06-14 DIAGNOSIS — I422 Other hypertrophic cardiomyopathy: Secondary | ICD-10-CM | POA: Insufficient documentation

## 2021-06-14 MED ORDER — METOPROLOL TARTRATE 5 MG/5ML IV SOLN
INTRAVENOUS | Status: AC
  Filled 2021-06-14: qty 5

## 2021-06-14 MED ORDER — REGADENOSON 0.4 MG/5ML IV SOLN
INTRAVENOUS | Status: AC
  Administered 2021-06-14: 0.4 mg via INTRAVENOUS
  Filled 2021-06-14: qty 5

## 2021-06-14 MED ORDER — ALBUTEROL SULFATE HFA 108 (90 BASE) MCG/ACT IN AERS
INHALATION_SPRAY | RESPIRATORY_TRACT | Status: AC
  Filled 2021-06-14: qty 6.7

## 2021-06-14 MED ORDER — GADOBUTROL 1 MMOL/ML IV SOLN
10.0000 mL | Freq: Once | INTRAVENOUS | Status: AC | PRN
  Administered 2021-06-14: 10 mL via INTRAVENOUS

## 2021-06-14 MED ORDER — AMINOPHYLLINE 25 MG/ML IV SOLN
INTRAVENOUS | Status: AC
  Filled 2021-06-14: qty 10

## 2021-06-14 MED ORDER — REGADENOSON 0.4 MG/5ML IV SOLN
0.4000 mg | Freq: Once | INTRAVENOUS | Status: AC
  Filled 2021-06-14: qty 5

## 2021-06-14 MED ORDER — NITROGLYCERIN 0.4 MG SL SUBL
SUBLINGUAL_TABLET | SUBLINGUAL | Status: AC
  Filled 2021-06-14: qty 1

## 2021-06-14 NOTE — Progress Notes (Signed)
Patient presents for stress MRI.  P 65, 155/86.  EKG shows NSR, LVH with repol abnormalities.  Lungs CTAB  Shared Decision Making/Informed Consent The risks [chest pain, shortness of breath, cardiac arrhythmias, dizziness, blood pressure fluctuations, myocardial infarction, stroke/transient ischemic attack, nausea, vomiting, allergic reaction, and life-threatening complications (estimated to be 1 in 10,000)], benefits (risk stratification, diagnosing coronary artery disease, treatment guidance) and alternatives of a MRI stress test were discussed in detail with Yolanda Lang and she agrees to proceed.

## 2021-06-14 NOTE — Progress Notes (Signed)
Post Lexiscan administration.  Pt denies chest pain or any other symptoms. Dr. Su Grand at [place].

## 2021-06-24 ENCOUNTER — Ambulatory Visit: Payer: Medicare Other | Admitting: Student

## 2021-06-28 ENCOUNTER — Telehealth: Payer: Self-pay | Admitting: Cardiology

## 2021-06-28 NOTE — Telephone Encounter (Signed)
Follow Up:     Patient is returning  Yolanda Lang's call from today, concerning MRI results.Yolanda Lang

## 2021-06-28 NOTE — Telephone Encounter (Signed)
Pt updated with Cardiac MRI results along with MD's recommendations and verbalized understanding. Appointment scheduled for 8/8

## 2021-07-22 ENCOUNTER — Ambulatory Visit (INDEPENDENT_AMBULATORY_CARE_PROVIDER_SITE_OTHER): Payer: Medicare Other | Admitting: Cardiology

## 2021-07-22 ENCOUNTER — Encounter (HOSPITAL_BASED_OUTPATIENT_CLINIC_OR_DEPARTMENT_OTHER): Payer: Self-pay | Admitting: Cardiology

## 2021-07-22 ENCOUNTER — Telehealth (HOSPITAL_BASED_OUTPATIENT_CLINIC_OR_DEPARTMENT_OTHER): Payer: Self-pay | Admitting: Cardiology

## 2021-07-22 ENCOUNTER — Other Ambulatory Visit: Payer: Self-pay

## 2021-07-22 VITALS — BP 134/90 | HR 66 | Ht 60.0 in | Wt 234.0 lb

## 2021-07-22 DIAGNOSIS — R0600 Dyspnea, unspecified: Secondary | ICD-10-CM

## 2021-07-22 DIAGNOSIS — I422 Other hypertrophic cardiomyopathy: Secondary | ICD-10-CM

## 2021-07-22 DIAGNOSIS — Z712 Person consulting for explanation of examination or test findings: Secondary | ICD-10-CM

## 2021-07-22 DIAGNOSIS — I10 Essential (primary) hypertension: Secondary | ICD-10-CM | POA: Diagnosis not present

## 2021-07-22 DIAGNOSIS — R002 Palpitations: Secondary | ICD-10-CM

## 2021-07-22 DIAGNOSIS — R0609 Other forms of dyspnea: Secondary | ICD-10-CM

## 2021-07-22 MED ORDER — METOPROLOL SUCCINATE ER 25 MG PO TB24: 25.0000 mg | ORAL_TABLET | Freq: Every day | ORAL | 3 refills | Status: DC

## 2021-07-22 NOTE — Telephone Encounter (Signed)
Left message for patient to call and schedule 3 month follow up appointment with Dr. Harrell Gave per 07/22/21 AVS

## 2021-07-22 NOTE — Patient Instructions (Signed)
Medication Instructions:  Start Metoprolol Succinate 25 mg daily  *If you need a refill on your cardiac medications before your next appointment, please call your pharmacy*   Lab Work: None ordered    Testing/Procedures: None ordered    Follow-Up: At [facility], you and your health needs are our priority.  As part of our continuing mission to provide you with exceptional heart care, we have created designated Provider Care Teams.  These Care Teams include your primary Cardiologist (physician) and Advanced Practice Providers (APPs -  Physician Assistants and Nurse Practitioners) who all work together to provide you with the care you need, when you need it.  We recommend signing up for the patient portal called "MyChart".  Sign up information is provided on this After Visit Summary.  MyChart is used to connect with patients for Virtual Visits (Telemedicine).  Patients are able to view lab/test results, encounter notes, upcoming appointments, etc.  Non-urgent messages can be sent to your provider as well.   To learn more about what you can do with MyChart, go to [email or web address].    Your next appointment:   3 month(s)  The format for your next appointment:   In Person  Provider:   Buford Dresser, MD

## 2021-07-22 NOTE — Progress Notes (Signed)
Cardiology Office Note:    Date:  07/22/2021   ID:  Yolanda Lang, DOB [birth year], MRN [id number]  PCP:  Sonia Side., FNP  Cardiologist:  Buford Dresser, MD  Referring MD: Sonia Side., FNP   CC: follow up of test results  History of Present Illness:    Yolanda Lang is a 75 y.o. female with a hx of apical variant hypertrophic cardiomyopathy, hypertension, OSA on BiPAP who is seen for follow up. I initially met her 10/01/2020 as a new consult at the request of Sonia Side., FNP for the evaluation and management of possible history of heart failure.  CV history: Moved to this area from [place] a year ago. Had a cardiologist in [place] for years. Saw Dr. Stark Klein, [phone]. [place]. Unable to find through Care Everywhere. Dr. Stark Klein managed her blood pressure for her.   In 2015, was seeing a pulmonologist, had a sleep study done. Uses BiPAP every night since that time. Was told by her cardiologist that she had the beginnings of CHF. Later told that this may have been inaccurate.   Family history: mother had COPD. Father had 2 strokes, died of MI at age 55. Sister deceased age 29, had a bad heart. She is the oldest of 10 children, only 6 still alive. Sister (living, 3 years younger) on dialysis, has a bad heart.   Today: She is accompanied by her husband, and her daughter by phone. Overall, she has been feeling fatigued and is having more frequent heart palpitations. The palpitations occur every day, and are typically short durations. Last night the episode lasted 10 minutes. Shortness of breath also correlates with her palpitations, and she usually tries to stay calm and wait for the symptoms to subside. Of note, the shortness of breath has not been as severe lately.    Since her last visit she denies any wheezing. She reports that albuterol is helping her symptom management. However, sometimes in the morning she feels the  palpitations after taking albuterol.  She reports having some LE edema. She denies any chest pain. No lightheadedness, headaches, syncope, orthopnea, or PND. Also has no exertional symptoms.  We reviewed the results of her cardiac stress MRI today. At this time she would like to defer further cardiac testing.  We discussed beta blockers today. She reports that she was on propranolol and metoprolol in the past, with good effect. She did not have worsening respiratory symptoms on them. Her beta blocker was stopped when she moved to [place].  Past Medical History:  Diagnosis Date   Arthritis    Cataracts, bilateral    CHF (congestive heart failure) (HCC)    Decreased vision    Diverticulosis    Forgetfulness    GERD (gastroesophageal reflux disease)    GI bleed    Hypertension    IBS (irritable bowel syndrome)    MVP (mitral valve prolapse)    OSA (obstructive sleep apnea)    Prediabetes    Psoriasis    Sleep apnea    BIPAP    Past Surgical History:  Procedure Laterality Date   APPENDECTOMY     BIOPSY  11/01/2019   Procedure: BIOPSY;  Surgeon: Doran Stabler, MD;  Location: [place];  Service: Gastroenterology;;   COLONOSCOPY WITH PROPOFOL N/A 11/01/2019   Procedure: COLONOSCOPY WITH PROPOFOL;  Surgeon: Doran Stabler, MD;  Location: [place];  Service: Gastroenterology;  Laterality: N/A;  ESOPHAGOGASTRODUODENOSCOPY (EGD) WITH PROPOFOL N/A 11/01/2019   Procedure: ESOPHAGOGASTRODUODENOSCOPY (EGD) WITH PROPOFOL;  Surgeon: Doran Stabler, MD;  Location: [place];  Service: Gastroenterology;  Laterality: N/A;   FOOT SURGERY Right    right foot joint removal    PARTIAL HYSTERECTOMY  2008   partial   POLYPECTOMY  11/01/2019   Procedure: POLYPECTOMY;  Surgeon: Doran Stabler, MD;  Location: [place];  Service: Gastroenterology;;    Current Medications: Current Outpatient Medications on File Prior to Visit  Medication Sig   albuterol (VENTOLIN HFA) 108  (90 Base) MCG/ACT inhaler SMARTSIG:1-2 Puff(s) Via Inhaler Every 6 Hours PRN   amLODipine (NORVASC) 5 MG tablet Take 5 mg by mouth daily.   budesonide-formoterol (SYMBICORT) 80-4.5 MCG/ACT inhaler SMARTSIG:2 Puff(s) By Mouth Morning-Evening   calcipotriene (DOVONOX) 0.005 % cream Apply 1 application topically in the morning and at bedtime.   cetirizine (ZYRTEC) 10 MG tablet Take 10 mg by mouth daily.   clotrimazole-betamethasone (LOTRISONE) cream Apply 1 application topically 2 (two) times daily as needed (for rash).   diphenoxylate-atropine (LOMOTIL) 2.5-0.025 MG tablet Take 1-2 tablets by mouth 2 (two) times daily as needed for diarrhea or loose stools.   donepezil (ARICEPT) 23 MG TABS tablet Take 23 mg by mouth at bedtime.   furosemide (LASIX) 40 MG tablet Take 40 mg by mouth daily.   montelukast (SINGULAIR) 10 MG tablet Take 10 mg by mouth daily.   oxymetazoline (AFRIN) 0.05 % nasal spray Place 2 sprays into both nostrils 2 (two) times daily.   pantoprazole (PROTONIX) 40 MG tablet Take 40 mg by mouth 2 (two) times daily.   valsartan (DIOVAN) 320 MG tablet Take 320 mg by mouth daily.   No current facility-administered medications on file prior to visit.     Allergies:   Aspirin and Other   Social History   Tobacco Use   Smoking status: Former    Types: Cigarettes    Quit date: 05/08/1985    Years since quitting: 36.2   Smokeless tobacco: Never   Tobacco comments:    quit in 1975  Vaping Use   Vaping Use: Never used  Substance Use Topics   Alcohol use: Never   Drug use: Never    Family History: family history includes COPD in her mother; Diabetes in her maternal grandmother, sister, and sister; Heart attack in her father; Heart disease in her father; Hypertension in her daughter, daughter, father, and mother; Pancreatic disease in her brother; Stroke in her father.  ROS:   Please see the history of present illness.   (+) Palpitations (+) Shortness of breath (+)  Fatigue (+) LE edema Additional pertinent ROS otherwise unremarkable.  EKGs/Labs/Other Studies Reviewed:    The following studies were reviewed today:  Cardiac MRI 06/14/2021: IMPRESSION: 1. Asymmetric LV hypertrophy measuring up to 28m in apical anterior wall (784min basal posterior wall), consistent with apical hypertrophic cardiomyopathy   2. Patchy late gadolinium enhancement at LV apex consistent with apical HCM. LGE accounts for less than 1% of total myocardial mass   3. Apical stress perfusion defect consistent with ischemia. However apical stress perfusion defects can be seen in apical HCM in the absence of obstructive CAD. Would consider coronary CTA to rule out obstructive CAD   4.  Normal LV size and hyperdynamic systolic function (EF 7135%  5.  Normal RV size and hyperdynamic systolic function (EF 7757% Echo 04/23/21 1. Left ventricular ejection fraction, by estimation, is 65 to 70%.  The  left ventricle has normal function. The left ventricle has no regional  wall motion abnormalities. There is severe left ventricular hypertrophy.  Left ventricular diastolic parameters   are consistent with Grade II diastolic dysfunction (pseudonormalization).  Elevated left atrial pressure.   2. Right ventricular systolic function is normal. The right ventricular  size is normal. There is moderately elevated pulmonary artery systolic  pressure. The estimated right ventricular systolic pressure is 69.6 mmHg.   3. The mitral valve is normal in structure. Trivial mitral valve  regurgitation.   4. The aortic valve was not well visualized. Aortic valve regurgitation  is not visualized. No aortic stenosis is present.   5. Aortic dilatation noted. There is mild dilatation of the ascending  aorta, measuring 38 mm.   6. Left atrial size was mildly dilated.   7. The inferior vena cava is normal in size with greater than 50%  respiratory variability, suggesting right atrial pressure of 3  mmHg.   8. Severe LVH, most pronounced at the apex with cavity obliteration in  systole. 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 Recommend  cardiac MRI for further evaluation  EKG:  EKG is personally reviewed.   07/22/2021: not ordered 05/24/2021: EKG was not ordered. 10/01/20: SR at 66 bpm with a PVC. Nonspecific ST-T wave abnormality. Appears in a different pattern than ECG from 10/30/19  Recent Labs: 05/23/2021: BUN 9; Creatinine, Ser 0.84; Hemoglobin 13.9; Platelets 292; Potassium 4.4; Sodium 142  Recent Lipid Panel No results found for: CHOL, TRIG, HDL, CHOLHDL, VLDL, LDLCALC, LDLDIRECT  Physical Exam:    VS:  BP 134/90   Pulse 66   Ht 5' (1.524 m)   Wt 234 lb (106.1 kg)   BMI 45.70 kg/m     Wt Readings from Last 3 Encounters:  07/22/21 234 lb (106.1 kg)  05/24/21 229 lb (103.9 kg)  05/23/21 231 lb 12.8 oz (105.1 kg)    GEN: Well nourished, well developed in no acute distress HEENT: Normal, moist mucous membranes NECK: No JVD CARDIAC: regular rhythm, normal S1 and S2, no rubs or gallops. No murmurs appreciated. VASCULAR: Radial and DP pulses 2+ bilaterally. No carotid bruits RESPIRATORY:  Clear to auscultation without rales, wheezing or rhonchi  ABDOMEN: Soft, non-tender, non-distended MUSCULOSKELETAL:  Ambulates independently SKIN: Warm and dry, trace edema NEUROLOGIC:  Alert and oriented x 3. No focal neuro deficits noted. PSYCHIATRIC:  Normal affect    ASSESSMENT:    1. Apical variant hypertrophic cardiomyopathy ([place])   2. DOE (dyspnea on exertion)   3. Primary hypertension   4. Heart palpitations   5. Encounter to discuss test results      PLAN:    Dyspnea on exertion Apical hypertrophic cardiomyopathy on echo -reviewed echo and now stress cardiac MRI results -supports Apical HCM. We did review that an anatomic test such as CT or cath could confirm that there is no ischemia, but as she is gradually improving in symptoms, she wishes to hold on  further testing at this time -we discussed beta blockers. She has not had any wheezing, though she is using both her symbicort and albuterol inhalers twice daily. -I recommended she continue the symbicort BID, but drop the albuterol to as needed -we will trial low dose metoprolol. She will contact me if her breathing worsens or she feels worse -at follow up, can attempt to uptitrate metoprolol based on blood pressure and heart rate  Hypertension: -longstanding, has been on medication since 1978 -starting metoprolol, as above -continue valsartan  320 mg daily, furosemide 40 mg daily, amlodipine 5 mg daily -If BP improves on beta blocker, may be able to adjust medications  Palpitations: -we reviewed. Previously discussed monitor. Will hold on further testing for now, but if symptoms worsen would consider Zio monitor.  Cardiac risk counseling and prevention recommendations: -recommend heart healthy/Mediterranean diet, with whole grains, fruits, vegetable, fish, lean meats, nuts, and olive oil. Limit salt. -recommend moderate walking, 3-5 times/week for 30-50 minutes each session. Aim for at least 150 minutes.week. Goal should be pace of 3 miles/hours, or walking 1.5 miles in 30 minutes -recommend avoidance of tobacco products. Avoid excess alcohol. -would not start aspirin given history of lower GI bleed/diverticulosis  Plan for follow up: 3 months or sooner as needed.  Buford Dresser, MD, PhD [place]  [facility]    Medication Adjustments/Labs and Tests Ordered: Current medicines are reviewed at length with the patient today.  Concerns regarding medicines are outlined above.   No orders of the defined types were placed in this encounter.  Meds ordered this encounter  Medications   metoprolol succinate (TOPROL XL) 25 MG 24 hr tablet    Sig: Take 1 tablet (25 mg total) by mouth daily.    Dispense:  90 tablet    Refill:  3    Patient Instructions  Medication  Instructions:  Start Metoprolol Succinate 25 mg daily  *If you need a refill on your cardiac medications before your next appointment, please call your pharmacy*   Lab Work: None ordered    Testing/Procedures: None ordered    Follow-Up: At [facility], you and your health needs are our priority.  As part of our continuing mission to provide you with exceptional heart care, we have created designated Provider Care Teams.  These Care Teams include your primary Cardiologist (physician) and Advanced Practice Providers (APPs -  Physician Assistants and Nurse Practitioners) who all work together to provide you with the care you need, when you need it.  We recommend signing up for the patient portal called "MyChart".  Sign up information is provided on this After Visit Summary.  MyChart is used to connect with patients for Virtual Visits (Telemedicine).  Patients are able to view lab/test results, encounter notes, upcoming appointments, etc.  Non-urgent messages can be sent to your provider as well.   To learn more about what you can do with MyChart, go to [email or web address].    Your next appointment:   3 month(s)  The format for your next appointment:   In Person  Provider:   Buford Dresser, MD     [facility],acting as a scribe for Buford Dresser, MD.,have documented all relevant documentation on the behalf of Buford Dresser, MD,as directed by  Buford Dresser, MD while in the presence of Buford Dresser, MD.  I, Buford Dresser, MD, have reviewed all documentation for this visit. The documentation on 07/22/21 for the exam, diagnosis, procedures, and orders are all accurate and complete.   Signed, Buford Dresser, MD PhD 07/22/2021 12:41 PM    [place]

## 2021-07-24 NOTE — Telephone Encounter (Signed)
Left message for patient to call and schedule 3 month follow up with Dr. Harrell Gave

## 2021-08-05 ENCOUNTER — Other Ambulatory Visit: Payer: Self-pay | Admitting: Family

## 2021-08-05 ENCOUNTER — Ambulatory Visit
Admission: RE | Admit: 2021-08-05 | Discharge: 2021-08-05 | Disposition: A | Discharge status: Home / Self Care | Payer: Medicare Other | Source: Ambulatory Visit | Attending: Family | Admitting: Family

## 2021-08-05 DIAGNOSIS — M25561 Pain in right knee: Secondary | ICD-10-CM

## 2021-08-05 DIAGNOSIS — M549 Dorsalgia, unspecified: Secondary | ICD-10-CM

## 2021-08-05 DIAGNOSIS — M5430 Sciatica, unspecified side: Secondary | ICD-10-CM

## 2021-08-05 DIAGNOSIS — M25562 Pain in left knee: Secondary | ICD-10-CM

## 2021-09-26 ENCOUNTER — Other Ambulatory Visit: Payer: Self-pay | Admitting: Gastroenterology

## 2021-09-26 ENCOUNTER — Other Ambulatory Visit: Payer: Self-pay

## 2021-09-26 MED ORDER — DIPHENOXYLATE-ATROPINE 2.5-0.025 MG PO TABS: 1.0000 | ORAL_TABLET | Freq: Two times a day (BID) | ORAL | 2 refills | Status: DC | PRN

## 2021-09-26 NOTE — Telephone Encounter (Signed)
Pt needs prescription for Lomotil. She stated that last time that she saw Dr. Loletha Carrow, he told her that he could prescribe it for her. Pls send It to [facility] on [address].

## 2021-10-08 ENCOUNTER — Other Ambulatory Visit: Payer: Self-pay

## 2021-10-08 ENCOUNTER — Encounter (HOSPITAL_BASED_OUTPATIENT_CLINIC_OR_DEPARTMENT_OTHER): Payer: Self-pay | Admitting: Cardiology

## 2021-10-08 ENCOUNTER — Ambulatory Visit (INDEPENDENT_AMBULATORY_CARE_PROVIDER_SITE_OTHER): Payer: Medicare Other | Admitting: Cardiology

## 2021-10-08 VITALS — BP 148/90 | HR 63 | Ht 60.0 in | Wt 231.6 lb

## 2021-10-08 DIAGNOSIS — I10 Essential (primary) hypertension: Secondary | ICD-10-CM

## 2021-10-08 DIAGNOSIS — Z7189 Other specified counseling: Secondary | ICD-10-CM

## 2021-10-08 DIAGNOSIS — I422 Other hypertrophic cardiomyopathy: Secondary | ICD-10-CM | POA: Diagnosis not present

## 2021-10-08 DIAGNOSIS — R0609 Other forms of dyspnea: Secondary | ICD-10-CM | POA: Diagnosis not present

## 2021-10-08 NOTE — Progress Notes (Signed)
Cardiology Office Note:    Date:  10/09/2021   ID:  Yolanda Lang, DOB 10/01/1946, MRN [id number]  PCP:  Sonia Side., FNP  Cardiologist:  Buford Dresser, MD  Referring MD: Sonia Side., FNP   CC: follow up  History of Present Illness:    Yolanda Lang is a 75 y.o. female with a hx of apical variant hypertrophic cardiomyopathy, hypertension, OSA on BiPAP who is seen for follow up. I initially met her 10/01/2020 as a new consult at the request of Sonia Side., FNP for the evaluation and management of possible history of heart failure.  CV history: Moved to this area from [place] a year ago. Had a cardiologist in [place] for years. Saw Dr. Stark Klein, [phone]. [place]. Unable to find through Care Everywhere. Dr. Stark Klein managed her blood pressure for her.   In 2015, was seeing a pulmonologist, had a sleep study done. Uses BiPAP every night since that time. Was told by her cardiologist that she had the beginnings of CHF. Later told that this may have been inaccurate.   Family history: mother had COPD. Father had 2 strokes, died of MI at age 40. Sister deceased age 45, had a bad heart. She is the oldest of 10 children, only 6 still alive. Sister (living, 3 years younger) on dialysis, has a bad heart.   Today: Overall she is feeling good physically. Unfortunately, her sister passed away this morning. She is understandably emotional about this, I offered my condolences.  At home she has not monitored her blood pressure for a few weeks due to a busy schedule. She has a wrist cuff at home, and usually uses her left wrist.  Since her last visit she has noticed any recurrent palpitations. Typically she continues to have issues with sinus congestion and arthritis. Taking her prescribed inhalers and ointments helps manage her symptoms.  She notes her shortness of breath has significantly improved since her initial visit. This morning she is  mildly short of breath, but she attributes this to being in a rush this morning. She is not as active as she wants to be, but states that this is because of her arthritic pain.  Generally, she sleeps well. Currently she is waiting to receive a new BiPAP. Her last sleep study was in 2015 while living in [place].  She denies any chest pain. No lightheadedness, headaches, syncope, orthopnea, PND, lower extremity edema.   Past Medical History:  Diagnosis Date   Arthritis    Cataracts, bilateral    CHF (congestive heart failure) (HCC)    Decreased vision    Diverticulosis    Forgetfulness    GERD (gastroesophageal reflux disease)    GI bleed    Hypertension    IBS (irritable bowel syndrome)    MVP (mitral valve prolapse)    OSA (obstructive sleep apnea)    Prediabetes    Psoriasis    Sleep apnea    BIPAP    Past Surgical History:  Procedure Laterality Date   APPENDECTOMY     BIOPSY  11/01/2019   Procedure: BIOPSY;  Surgeon: Doran Stabler, MD;  Location: [facility];  Service: Gastroenterology;;   COLONOSCOPY WITH PROPOFOL N/A 11/01/2019   Procedure: COLONOSCOPY WITH PROPOFOL;  Surgeon: Doran Stabler, MD;  Location: [facility];  Service: Gastroenterology;  Laterality: N/A;   ESOPHAGOGASTRODUODENOSCOPY (EGD) WITH PROPOFOL N/A 11/01/2019   Procedure: ESOPHAGOGASTRODUODENOSCOPY (EGD) WITH PROPOFOL;  Surgeon: Doran Stabler, MD;  Location: [facility];  Service: Gastroenterology;  Laterality: N/A;   FOOT SURGERY Right    right foot joint removal    PARTIAL HYSTERECTOMY  2008   partial   POLYPECTOMY  11/01/2019   Procedure: POLYPECTOMY;  Surgeon: Doran Stabler, MD;  Location: [facility];  Service: Gastroenterology;;    Current Medications: Current Outpatient Medications on File Prior to Visit  Medication Sig   albuterol (VENTOLIN HFA) 108 (90 Base) MCG/ACT inhaler SMARTSIG:1-2 Puff(s) Via Inhaler Every 6 Hours PRN   amLODipine (NORVASC) 5 MG tablet Take  5 mg by mouth daily.   budesonide-formoterol (SYMBICORT) 80-4.5 MCG/ACT inhaler SMARTSIG:2 Puff(s) By Mouth Morning-Evening   calcipotriene (DOVONOX) 0.005 % cream Apply 1 application topically in the morning and at bedtime.   cetirizine (ZYRTEC) 10 MG tablet Take 10 mg by mouth daily.   clotrimazole-betamethasone (LOTRISONE) cream Apply 1 application topically 2 (two) times daily as needed (for rash).   diphenoxylate-atropine (LOMOTIL) 2.5-0.025 MG tablet Take 1-2 tablets by mouth 2 (two) times daily as needed for diarrhea or loose stools.   donepezil (ARICEPT) 23 MG TABS tablet Take 23 mg by mouth at bedtime.   furosemide (LASIX) 40 MG tablet Take 40 mg by mouth daily.   metoprolol succinate (TOPROL XL) 25 MG 24 hr tablet Take 1 tablet (25 mg total) by mouth daily.   montelukast (SINGULAIR) 10 MG tablet Take 10 mg by mouth daily.   oxymetazoline (AFRIN) 0.05 % nasal spray Place 2 sprays into both nostrils 2 (two) times daily.   pantoprazole (PROTONIX) 40 MG tablet Take 40 mg by mouth 2 (two) times daily.   valsartan (DIOVAN) 320 MG tablet Take 320 mg by mouth daily.   No current facility-administered medications on file prior to visit.     Allergies:   Aspirin and Other   Social History   Tobacco Use   Smoking status: Former    Types: Cigarettes    Quit date: 05/08/1985    Years since quitting: 36.4   Smokeless tobacco: Never   Tobacco comments:    quit in 1975  Vaping Use   Vaping Use: Never used  Substance Use Topics   Alcohol use: Never   Drug use: Never    Family History: family history includes COPD in her mother; Diabetes in her maternal grandmother, sister, and sister; Heart attack in her father; Heart disease in her father; Hypertension in her daughter, daughter, father, and mother; Pancreatic disease in her brother; Stroke in her father.  ROS:   Please see the history of present illness.   (+) Arthralgias (+) Shortness of breath Additional pertinent ROS otherwise  unremarkable.  EKGs/Labs/Other Studies Reviewed:    The following studies were reviewed today:  Cardiac MRI 06/14/2021: IMPRESSION: 1. Asymmetric LV hypertrophy measuring up to 31mm in apical anterior wall (59mm in basal posterior wall), consistent with apical hypertrophic cardiomyopathy   2. Patchy late gadolinium enhancement at LV apex consistent with apical HCM. LGE accounts for less than 1% of total myocardial mass   3. Apical stress perfusion defect consistent with ischemia. However apical stress perfusion defects can be seen in apical HCM in the absence of obstructive CAD. Would consider coronary CTA to rule out obstructive CAD   4.  Normal LV size and hyperdynamic systolic function (EF 79%)   5.  Normal RV size and hyperdynamic systolic function (EF 89%)  Echo 04/23/21 1. Left ventricular ejection fraction, by estimation, is 65 to  70%. The  left ventricle has normal function. The left ventricle has no regional  wall motion abnormalities. There is severe left ventricular hypertrophy.  Left ventricular diastolic parameters   are consistent with Grade II diastolic dysfunction (pseudonormalization).  Elevated left atrial pressure.   2. Right ventricular systolic function is normal. The right ventricular  size is normal. There is moderately elevated pulmonary artery systolic  pressure. The estimated right ventricular systolic pressure is 93.2 mmHg.   3. The mitral valve is normal in structure. Trivial mitral valve  regurgitation.   4. The aortic valve was not well visualized. Aortic valve regurgitation  is not visualized. No aortic stenosis is present.   5. Aortic dilatation noted. There is mild dilatation of the ascending  aorta, measuring 38 mm.   6. Left atrial size was mildly dilated.   7. The inferior vena cava is normal in size with greater than 50%  respiratory variability, suggesting right atrial pressure of 3 mmHg.   8. Severe LVH, most pronounced at the apex with  cavity obliteration in  systole. 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 Recommend  cardiac MRI for further evaluation  EKG:  EKG is personally reviewed.   06/14/21: SR at 65 bpm, LVH with repol 10/01/20: SR at 66 bpm with a PVC. Nonspecific ST-T wave abnormality. Appears in a different pattern than ECG from 10/30/19  Recent Labs: 05/23/2021: BUN 9; Creatinine, Ser 0.84; Hemoglobin 13.9; Platelets 292; Potassium 4.4; Sodium 142  Recent Lipid Panel No results found for: CHOL, TRIG, HDL, CHOLHDL, VLDL, LDLCALC, LDLDIRECT  Physical Exam:    VS:  BP (!) 148/90   Pulse 63   Ht 5' (1.524 m)   Wt 231 lb 9.6 oz (105.1 kg)   SpO2 94%   BMI 45.23 kg/m     Wt Readings from Last 3 Encounters:  10/08/21 231 lb 9.6 oz (105.1 kg)  07/22/21 234 lb (106.1 kg)  05/24/21 229 lb (103.9 kg)    GEN: Well nourished, well developed in no acute distress HEENT: Normal, moist mucous membranes NECK: No JVD CARDIAC: regular rhythm, normal S1 and S2, no rubs or gallops. No murmurs appreciated. VASCULAR: Radial and DP pulses 2+ bilaterally. No carotid bruits RESPIRATORY:  Clear to auscultation without rales, wheezing or rhonchi  ABDOMEN: Soft, non-tender, non-distended MUSCULOSKELETAL:  Ambulates independently SKIN: Warm and dry, trace bilateral LE edema NEUROLOGIC:  Alert and oriented x 3. No focal neuro deficits noted. PSYCHIATRIC:  Normal affect    ASSESSMENT:    1. Apical variant hypertrophic cardiomyopathy ([place])   2. Primary hypertension   3. DOE (dyspnea on exertion)   4. Counseling on health promotion and disease prevention    PLAN:    Dyspnea on exertion Apical hypertrophic cardiomyopathy on echo -reviewed echo and now stress cardiac MRI results -supports Apical HCM. We did review that an anatomic test such as CT or cath could confirm that there is no ischemia, but as she is gradually improving in symptoms, she wishes to hold on further testing at this time -tolerating  metoprolol  Hypertension: -longstanding, has been on medication since 1978 -continue valsartan 320 mg daily, furosemide 40 mg daily, amlodipine 5 mg daily, metoprolol succinate 25 mg daily -with passing of her sister this morning, understandably emotional and stressed. She will restart blood pressure monitoring at home in several weeks and contact me if they remain above 130/80  Palpitations: -we reviewed. Previously discussed monitor. Will hold on further testing for now, but if symptoms worsen would consider  Zio monitor.  Cardiac risk counseling and prevention recommendations: -recommend heart healthy/Mediterranean diet, with whole grains, fruits, vegetable, fish, lean meats, nuts, and olive oil. Limit salt. -recommend moderate walking, 3-5 times/week for 30-50 minutes each session. Aim for at least 150 minutes.week. Goal should be pace of 3 miles/hours, or walking 1.5 miles in 30 minutes -recommend avoidance of tobacco products. Avoid excess alcohol. -would not start aspirin given history of lower GI bleed/diverticulosis  Plan for follow up: 6 months per patient preference.  Buford Dresser, MD, PhD [place]  [facility]    Medication Adjustments/Labs and Tests Ordered: Current medicines are reviewed at length with the patient today.  Concerns regarding medicines are outlined above.   No orders of the defined types were placed in this encounter.  No orders of the defined types were placed in this encounter.   Patient Instructions  Medication Instructions:  No changes *If you need a refill on your cardiac medications before your next appointment, please call your pharmacy*   Lab Work: None today If you have labs (blood work) drawn today and your tests are completely normal, you will receive your results only by: [place] (if you have MyChart) OR A paper copy in the mail If you have any lab test that is abnormal or we need to change your treatment, we will  call you to review the results.   Testing/Procedures: None today   Follow-Up: At [facility], you and your health needs are our priority.  As part of our continuing mission to provide you with exceptional heart care, we have created designated Provider Care Teams.  These Care Teams include your primary Cardiologist (physician) and Advanced Practice Providers (APPs -  Physician Assistants and Nurse Practitioners) who all work together to provide you with the care you need, when you need it.  We recommend signing up for the patient portal called "MyChart".  Sign up information is provided on this After Visit Summary.  MyChart is used to connect with patients for Virtual Visits (Telemedicine).  Patients are able to view lab/test results, encounter notes, upcoming appointments, etc.  Non-urgent messages can be sent to your provider as well.   To learn more about what you can do with MyChart, go to [email or web address].    Your next appointment:   6 month(s)  The format for your next appointment:   In Person  Provider:   Buford Dresser, MD   Other Instructions Ideally blood pressure should be <130/80, but I would like to you to call me if your home blood pressure is consistently >140/90. Wrist cuffs can sometimes vary a bit, so you can use the average.   I,Mathew Stumpf,acting as a Education administrator for [employer], MD.,have documented all relevant documentation on the behalf of Buford Dresser, MD,as directed by  Buford Dresser, MD while in the presence of Buford Dresser, MD.  I, Buford Dresser, MD, have reviewed all documentation for this visit. The documentation on 10/09/21 for the exam, diagnosis, procedures, and orders are all accurate and complete.   Signed, Buford Dresser, MD PhD 10/09/2021 3:55 PM    [place]

## 2021-10-08 NOTE — Patient Instructions (Signed)
Medication Instructions:  No changes *If you need a refill on your cardiac medications before your next appointment, please call your pharmacy*   Lab Work: None today If you have labs (blood work) drawn today and your tests are completely normal, you will receive your results only by: [place] (if you have MyChart) OR A paper copy in the mail If you have any lab test that is abnormal or we need to change your treatment, we will call you to review the results.   Testing/Procedures: None today   Follow-Up: At [facility], you and your health needs are our priority.  As part of our continuing mission to provide you with exceptional heart care, we have created designated Provider Care Teams.  These Care Teams include your primary Cardiologist (physician) and Advanced Practice Providers (APPs -  Physician Assistants and Nurse Practitioners) who all work together to provide you with the care you need, when you need it.  We recommend signing up for the patient portal called "MyChart".  Sign up information is provided on this After Visit Summary.  MyChart is used to connect with patients for Virtual Visits (Telemedicine).  Patients are able to view lab/test results, encounter notes, upcoming appointments, etc.  Non-urgent messages can be sent to your provider as well.   To learn more about what you can do with MyChart, go to [email or web address].    Your next appointment:   6 month(s)  The format for your next appointment:   In Person  Provider:   Buford Dresser, MD   Other Instructions Ideally blood pressure should be <130/80, but I would like to you to call me if your home blood pressure is consistently >140/90. Wrist cuffs can sometimes vary a bit, so you can use the average.

## 2021-12-06 ENCOUNTER — Telehealth: Payer: Self-pay | Admitting: Gastroenterology

## 2021-12-06 MED ORDER — DIPHENOXYLATE-ATROPINE 2.5-0.025 MG PO TABS: 1.0000 | ORAL_TABLET | Freq: Two times a day (BID) | ORAL | 2 refills | Status: DC | PRN

## 2021-12-06 NOTE — Telephone Encounter (Signed)
Patient called this morning requesting a refill of diphenoxylate-atropine, 2.5 mg.  She said as of now she has 7 left, but she is experiencing pain, cramping, and diarrhea and will be taking them and does not want to run out.  If there is a problem with granting her a refill, please call patient and let her know.  Thank you.

## 2021-12-06 NOTE — Telephone Encounter (Signed)
Faxed script to patient pharmacy.

## 2022-03-20 ENCOUNTER — Other Ambulatory Visit: Payer: Self-pay | Admitting: Gastroenterology

## 2022-03-20 MED ORDER — DIPHENOXYLATE-ATROPINE 2.5-0.025 MG PO TABS: 1.0000 | ORAL_TABLET | Freq: Two times a day (BID) | ORAL | 0 refills | Status: DC | PRN

## 2022-03-20 NOTE — Telephone Encounter (Signed)
Patient is requesting lomotil refill sent to pharmacy. Please advise.  ?

## 2022-03-20 NOTE — Telephone Encounter (Signed)
Refilled Lomotil for 30 day supply only. Patient needs office visit. ?

## 2022-03-20 NOTE — Telephone Encounter (Signed)
Sent in 30 day, PMDP was reviewed.  ?

## 2022-05-08 ENCOUNTER — Other Ambulatory Visit: Payer: Self-pay | Admitting: Physician Assistant

## 2022-05-09 ENCOUNTER — Ambulatory Visit (INDEPENDENT_AMBULATORY_CARE_PROVIDER_SITE_OTHER): Payer: Medicare HMO | Admitting: Cardiology

## 2022-05-09 VITALS — BP 134/82 | HR 60 | Ht 60.0 in | Wt 240.3 lb

## 2022-05-09 DIAGNOSIS — I422 Other hypertrophic cardiomyopathy: Secondary | ICD-10-CM | POA: Diagnosis not present

## 2022-05-09 DIAGNOSIS — I10 Essential (primary) hypertension: Secondary | ICD-10-CM

## 2022-05-09 DIAGNOSIS — R079 Chest pain, unspecified: Secondary | ICD-10-CM | POA: Diagnosis not present

## 2022-05-09 DIAGNOSIS — Z7189 Other specified counseling: Secondary | ICD-10-CM | POA: Diagnosis not present

## 2022-05-09 NOTE — Patient Instructions (Signed)
Medication Instructions:  Your Physician recommend you continue on your current medication as directed.    *If you need a refill on your cardiac medications before your next appointment, please call your pharmacy*  Follow-Up: At [facility], you and your health needs are our priority.  As part of our continuing mission to provide you with exceptional heart care, we have created designated Provider Care Teams.  These Care Teams include your primary Cardiologist (physician) and Advanced Practice Providers (APPs -  Physician Assistants and Nurse Practitioners) who all work together to provide you with the care you need, when you need it.  We recommend signing up for the patient portal called "MyChart".  Sign up information is provided on this After Visit Summary.  MyChart is used to connect with patients for Virtual Visits (Telemedicine).  Patients are able to view lab/test results, encounter notes, upcoming appointments, etc.  Non-urgent messages can be sent to your provider as well.   To learn more about what you can do with MyChart, go to [email or web address].    Your next appointment:   1 year(s)  The format for your next appointment:   In Person  Provider:   Buford Dresser, MD{  Important Information About Sugar

## 2022-05-09 NOTE — Progress Notes (Signed)
Cardiology Office Note:    Date:  05/09/2022   ID:  Dim Meisinger, DOB 10/25/1946, MRN [id number]  PCP:  Sonia Side., FNP  Cardiologist:  Buford Dresser, MD  Referring MD: Sonia Side., FNP   CC: follow up  History of Present Illness:    Yolanda Lang is a 76 y.o. female with a hx of apical variant hypertrophic cardiomyopathy, hypertension, OSA on BiPAP who is seen for follow up. I initially met her 10/01/2020 as a new consult at the request of Sonia Side., FNP for the evaluation and management of possible history of heart failure.  CV history: Moved to this area from [place]. Had a cardiologist in [place] for years. Saw Dr. Stark Klein, [phone]. [place]. Unable to find through Care Everywhere. Dr. Stark Klein managed her blood pressure for her.   In 2015, was seeing a pulmonologist, had a sleep study done. Uses BiPAP every night since that time. Was told by her cardiologist that she had the beginnings of CHF. Later told that this may have been inaccurate.   Family history: mother had COPD. Father had 2 strokes, died of MI at age 62. Sister deceased age 40, had a bad heart. She is the oldest of 10 children, only 6 still alive. Sister (living, 3 years younger) on dialysis, has a bad heart.   At her last visit, she was struggling with the recent death of her sister. However, she felt well physically with no recurrent palpitations and overall improved shortness of breath. Her activity was limited by arthritic pain, and she was waiting on a new BiPAP. She was asked to resume blood pressure monitoring at home.  Today:    Patient has a sinus infection the last few weeks.   The patient has been experiencing chest discomfort for the last 3 weeks. It is not tender. She believes she may be pulling a muscle, or sleeping in an awkward position as she sleeps with both a pregnancy pillow and a BiPAP machine. She notices it often when she's getting  in/out of bed. It lasts for awhile, but she relaxes and takes deep breaths until the pain passes.  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  Her blood pressure was initially 156/84 in clinic today. Her blood pressure has been stable when she measured it at home, but it is high today. She believes her blood pressure is elevated today because her cab got a flat tire and she was concerned about making it to her appointment on time. Upon recheck, her blood pressure was 134/82.   The patient has been compliant with her medication.  She denies any palpitations, shortness of breath, or peripheral edema. No lightheadedness, headaches, syncope, orthopnea, or PND.    Past Medical History:  Diagnosis Date   Arthritis    Cataracts, bilateral    CHF (congestive heart failure) (HCC)    Decreased vision    Diverticulosis    Forgetfulness    GERD (gastroesophageal reflux disease)    GI bleed    Hypertension    IBS (irritable bowel syndrome)    MVP (mitral valve prolapse)    OSA (obstructive sleep apnea)    Prediabetes    Psoriasis    Sleep apnea    BIPAP    Past Surgical History:  Procedure Laterality Date   APPENDECTOMY     BIOPSY  11/01/2019   Procedure: BIOPSY;  Surgeon: Doran Stabler, MD;  Location: [facility];  Service: Gastroenterology;;   COLONOSCOPY WITH PROPOFOL N/A 11/01/2019   Procedure: COLONOSCOPY WITH PROPOFOL;  Surgeon: Doran Stabler, MD;  Location: [facility];  Service: Gastroenterology;  Laterality: N/A;   ESOPHAGOGASTRODUODENOSCOPY (EGD) WITH PROPOFOL N/A 11/01/2019   Procedure: ESOPHAGOGASTRODUODENOSCOPY (EGD) WITH PROPOFOL;  Surgeon: Doran Stabler, MD;  Location: [facility];  Service: Gastroenterology;  Laterality: N/A;   FOOT SURGERY Right    right foot joint removal    PARTIAL HYSTERECTOMY  2008   partial   POLYPECTOMY  11/01/2019   Procedure: POLYPECTOMY;  Surgeon: Doran Stabler, MD;   Location: [facility];  Service: Gastroenterology;;    Current Medications: Current Outpatient Medications on File Prior to Visit  Medication Sig   albuterol (VENTOLIN HFA) 108 (90 Base) MCG/ACT inhaler SMARTSIG:1-2 Puff(s) Via Inhaler Every 6 Hours PRN   amLODipine (NORVASC) 5 MG tablet Take 5 mg by mouth daily.   budesonide-formoterol (SYMBICORT) 80-4.5 MCG/ACT inhaler SMARTSIG:2 Puff(s) By Mouth Morning-Evening   calcipotriene (DOVONOX) 0.005 % cream Apply 1 application topically in the morning and at bedtime.   cetirizine (ZYRTEC) 10 MG tablet Take 10 mg by mouth daily.   clotrimazole-betamethasone (LOTRISONE) cream Apply 1 application topically 2 (two) times daily as needed (for rash).   diphenoxylate-atropine (LOMOTIL) 2.5-0.025 MG tablet Take 1-2 tablets by mouth 2 (two) times daily as needed for diarrhea or loose stools.   donepezil (ARICEPT) 23 MG TABS tablet Take 23 mg by mouth at bedtime.   furosemide (LASIX) 40 MG tablet Take 40 mg by mouth daily.   metoprolol succinate (TOPROL XL) 25 MG 24 hr tablet Take 1 tablet (25 mg total) by mouth daily.   montelukast (SINGULAIR) 10 MG tablet Take 10 mg by mouth daily.   oxymetazoline (AFRIN) 0.05 % nasal spray Place 2 sprays into both nostrils 2 (two) times daily.   pantoprazole (PROTONIX) 40 MG tablet Take 40 mg by mouth 2 (two) times daily.   valsartan (DIOVAN) 320 MG tablet Take 320 mg by mouth daily.   No current facility-administered medications on file prior to visit.     Allergies:   Aspirin and Other   Social History   Tobacco Use   Smoking status: Former    Types: Cigarettes    Quit date: 05/08/1985    Years since quitting: 37.0   Smokeless tobacco: Never   Tobacco comments:    quit in 1975  Vaping Use   Vaping Use: Never used  Substance Use Topics   Alcohol use: Never   Drug use: Never    Family History: family history includes COPD in her mother; Diabetes in her maternal grandmother, sister, and sister; Heart  attack in her father; Heart disease in her father; Hypertension in her daughter, daughter, father, and mother; Pancreatic disease in her brother; Stroke in her father.  ROS:   Please see the history of present illness.    (+) Chest discomfort (+) Stress (+) Back Pain  Additional pertinent ROS otherwise unremarkable.  EKGs/Labs/Other Studies Reviewed:    The following studies were reviewed today:  Cardiac MRI 06/14/2021: IMPRESSION: 1. Asymmetric LV hypertrophy measuring up to 51m in apical anterior wall (779min basal posterior wall), consistent with apical hypertrophic cardiomyopathy   2. Patchy late gadolinium enhancement at LV apex consistent with apical HCM. LGE accounts for less than 1% of total myocardial mass   3. Apical stress perfusion defect consistent with ischemia. However apical  stress perfusion defects can be seen in apical HCM in the absence of obstructive CAD. Would consider coronary CTA to rule out obstructive CAD   4.  Normal LV size and hyperdynamic systolic function (EF 44%)   5.  Normal RV size and hyperdynamic systolic function (EF 62%)  Echo 04/23/21 1. Left ventricular ejection fraction, by estimation, is 65 to 70%. The  left ventricle has normal function. The left ventricle has no regional  wall motion abnormalities. There is severe left ventricular hypertrophy.  Left ventricular diastolic parameters   are consistent with Grade II diastolic dysfunction (pseudonormalization).  Elevated left atrial pressure.   2. Right ventricular systolic function is normal. The right ventricular  size is normal. There is moderately elevated pulmonary artery systolic  pressure. The estimated right ventricular systolic pressure is 86.3 mmHg.   3. The mitral valve is normal in structure. Trivial mitral valve  regurgitation.   4. The aortic valve was not well visualized. Aortic valve regurgitation  is not visualized. No aortic stenosis is present.   5. Aortic  dilatation noted. There is mild dilatation of the ascending  aorta, measuring 38 mm.   6. Left atrial size was mildly dilated.   7. The inferior vena cava is normal in size with greater than 50%  respiratory variability, suggesting right atrial pressure of 3 mmHg.   8. Severe LVH, most pronounced at the apex with cavity obliteration in  systole. 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 Recommend  cardiac MRI for further evaluation  EKG:  EKG is personally reviewed.   05/09/22: SR at 60 bpm, LVH 06/14/21: SR at 65 bpm, LVH with repol 10/01/20: SR at 66 bpm with a PVC. Nonspecific ST-T wave abnormality. Appears in a different pattern than ECG from 10/30/19  Recent Labs: 05/23/2021: BUN 9; Creatinine, Ser 0.84; Hemoglobin 13.9; Platelets 292; Potassium 4.4; Sodium 142  Recent Lipid Panel No results found for: CHOL, TRIG, HDL, CHOLHDL, VLDL, LDLCALC, LDLDIRECT  Physical Exam:    VS:  BP 134/82   Pulse 60   Ht 5' (1.524 m)   Wt 240 lb 4.8 oz (109 kg)   BMI 46.93 kg/m     Wt Readings from Last 3 Encounters:  05/09/22 240 lb 4.8 oz (109 kg)  10/08/21 231 lb 9.6 oz (105.1 kg)  07/22/21 234 lb (106.1 kg)    GEN: Well nourished, well developed in no acute distress HEENT: Normal, moist mucous membranes NECK: No JVD CARDIAC: regular rhythm, normal S1 and S2, no rubs or gallops. No murmurs appreciated. VASCULAR: Radial and DP pulses 2+ bilaterally. No carotid bruits RESPIRATORY:  Clear to auscultation without rales, wheezing or rhonchi  ABDOMEN: Soft, non-tender, non-distended MUSCULOSKELETAL:  Ambulates independently SKIN: Warm and dry, trace bilateral LE edema  NEUROLOGIC:  Alert and oriented x 3. No focal neuro deficits noted. PSYCHIATRIC:  Normal affect    ASSESSMENT:    1. Chest pain of uncertain etiology   2. Apical variant hypertrophic cardiomyopathy ([place])   3. Primary hypertension   4. Cardiac risk counseling   5. Counseling on health promotion and disease  prevention     PLAN:    Chest pain -brief, with changing positions in the bed. Otherwise not related to exertion -no abnormalities on exam today, ECG stable -suspect MSK, but counseled on red flag warning signs that need immediate medical attention  Dyspnea on exertion Apical hypertrophic cardiomyopathy on echo -reviewed echo and stress cardiac MRI results -supports Apical HCM. We did review that an anatomic  test such as CT or cath could confirm that there is no ischemia, but as she is gradually improving in symptoms, she wishes to hold on further testing at this time -tolerating metoprolol  Hypertension: -longstanding, has been on medication since 1978 -continue valsartan 320 mg daily, furosemide 40 mg daily, amlodipine 5 mg daily, metoprolol succinate 25 mg daily  Cardiac risk counseling and prevention recommendations: -recommend heart healthy/Mediterranean diet, with whole grains, fruits, vegetable, fish, lean meats, nuts, and olive oil. Limit salt. -recommend moderate walking, 3-5 times/week for 30-50 minutes each session. Aim for at least 150 minutes.week. Goal should be pace of 3 miles/hours, or walking 1.5 miles in 30 minutes -recommend avoidance of tobacco products. Avoid excess alcohol. -would not start aspirin given history of lower GI bleed/diverticulosis  Plan for follow up: 1 year or sooner if needed  Buford Dresser, MD, PhD, [facility]    Medication Adjustments/Labs and Tests Ordered: Current medicines are reviewed at length with the patient today.  Concerns regarding medicines are outlined above.   Orders Placed This Encounter  Procedures   EKG 12-Lead   No orders of the defined types were placed in this encounter.  Patient Instructions  Medication Instructions:  Your Physician recommend you continue on your current medication as directed.    *If you need a refill on your cardiac medications before your next appointment, please  call your pharmacy*  Follow-Up: At [facility], you and your health needs are our priority.  As part of our continuing mission to provide you with exceptional heart care, we have created designated Provider Care Teams.  These Care Teams include your primary Cardiologist (physician) and Advanced Practice Providers (APPs -  Physician Assistants and Nurse Practitioners) who all work together to provide you with the care you need, when you need it.  We recommend signing up for the patient portal called "MyChart".  Sign up information is provided on this After Visit Summary.  MyChart is used to connect with patients for Virtual Visits (Telemedicine).  Patients are able to view lab/test results, encounter notes, upcoming appointments, etc.  Non-urgent messages can be sent to your provider as well.   To learn more about what you can do with MyChart, go to [email or web address].    Your next appointment:   1 year(s)  The format for your next appointment:   In Person  Provider:   Buford Dresser, MD{  Important Information About Sugar         I,Mathew Stumpf,acting as a scribe for Buford Dresser, MD.,have documented all relevant documentation on the behalf of Buford Dresser, MD,as directed by  Buford Dresser, MD while in the presence of Buford Dresser, MD.  I, Buford Dresser, MD, have reviewed all documentation for this visit. The documentation on 05/09/22 for the exam, diagnosis, procedures, and orders are all accurate and complete.   Signed, Buford Dresser, MD PhD 05/09/2022     [place]

## 2022-05-14 NOTE — Telephone Encounter (Signed)
I will refill the medicine. However, it has been nearly a year since she last saw me, so please arrange follow-up with me in the next 2 months.  HD

## 2022-05-20 ENCOUNTER — Encounter (HOSPITAL_BASED_OUTPATIENT_CLINIC_OR_DEPARTMENT_OTHER): Payer: Self-pay | Admitting: Cardiology

## 2022-05-21 IMAGING — MR MR CARDIAC FOR FUNCTION WO/W CM
45 of 48 series · 45 of 48 positions shown · IV contrast (Contrast agent)
Comparison: none

CLINICAL DATA: Stress JOSHUA to evaluate for ischemia

[Series 4: t2_haste_db_tra_bh · axial · 8.0mm · 1.41mm/px · 1 of 18 slices shown]
[im 1/18]
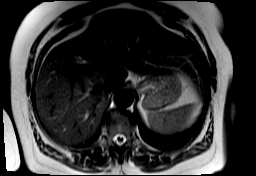

[Series 8: bSSFP · oblique · 6.0mm · 1.41mm/px · 1 of 25 slices shown (1 of 20)]
[im 1/25]
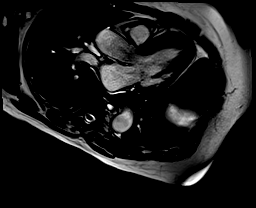

[Series 9: bSSFP · oblique · 6.0mm · 1.41mm/px · 1 of 25 slices shown (2 of 20)]
[im 1/25]
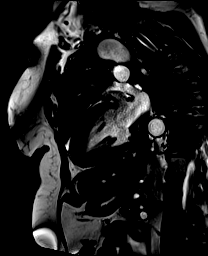

[Series 10: bSSFP · axial · 6.0mm · 1.41mm/px · 1 of 25 slices shown (3 of 20)]
[im 1/25]
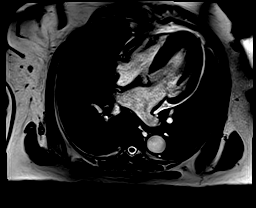

[Series 11: (id)_long_t1 · oblique · 8.0mm · 1.56mm/px · 1 of 24 slices shown]
[im 1/24]
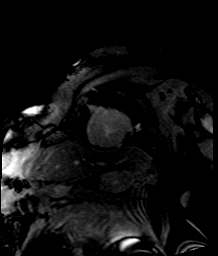

[Series 12: (id)_long_t1_moco · oblique · 8.0mm · 1.56mm/px · 1 of 24 slices shown]
[im 1/24]
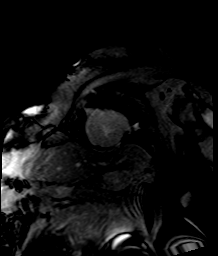

[Series 13: (id)_long_t1_moco_t1 · oblique · 8.0mm · 1.56mm/px · 1 of 6 slices shown]
[im 1/6]
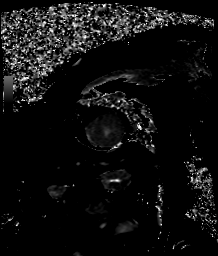

[Series 15: (id)_trufi · oblique · 8.0mm · 2.08mm/px · 1 of 9 slices shown]
[im 1/9]
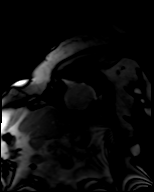

[Series 16: (id)_trufi_moco · oblique · 8.0mm · 2.08mm/px · 1 of 9 slices shown]
[im 1/9]
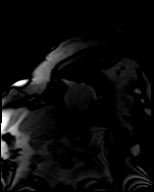

[Series 17: (id)_trufi_moco_t2 · oblique · 8.0mm · 2.08mm/px · 1 of 3 slices shown]
[im 1/3]
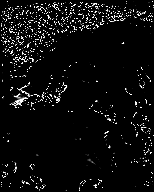

[Series 19: pre short axis · oblique · non-contrast · 8.0mm · 2.25mm/px · 1 of 10 slices shown (1 of 6)]
[im 1/10]
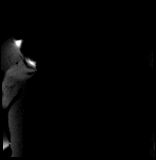

[Series 20: pre short axis · oblique · non-contrast · 8.0mm · 2.25mm/px · 1 of 10 slices shown (2 of 6)]
[im 1/10]
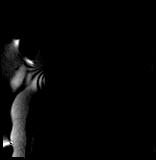

[Series 21: pre short axis · oblique · non-contrast · 8.0mm · 2.25mm/px · 1 of 10 slices shown (3 of 6)]
[im 1/10]
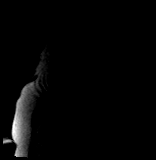

[Series 22: pre short axis · oblique · non-contrast · 8.0mm · 2.25mm/px · 1 of 10 slices shown (4 of 6)]
[im 1/10]
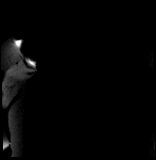

[Series 23: pre short axis · oblique · non-contrast · 8.0mm · 2.25mm/px · 1 of 10 slices shown (5 of 6)]
[im 1/10]
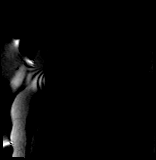

[Series 24: pre short axis · oblique · non-contrast · 8.0mm · 2.25mm/px · 1 of 10 slices shown (6 of 6)]
[im 1/10]
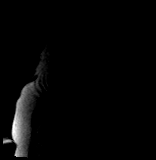

[Series 25: stress short axis · oblique · 8.0mm · 2.25mm/px · 1 of 60 slices shown (1 of 6)]
[im 1/60]
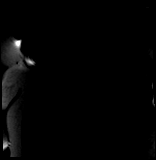

[Series 26: stress short axis · oblique · 8.0mm · 2.25mm/px · 1 of 60 slices shown (2 of 6)]
[im 1/60]
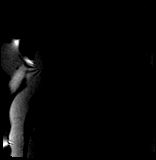

[Series 27: stress short axis · oblique · 8.0mm · 2.25mm/px · 1 of 60 slices shown (3 of 6)]
[im 1/60]
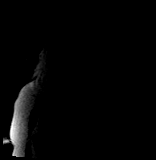

[Series 28: stress short axis · oblique · 8.0mm · 2.25mm/px · 1 of 60 slices shown (4 of 6)]
[im 1/60]
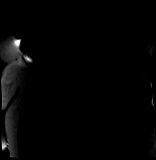

[Series 29: stress short axis · oblique · 8.0mm · 2.25mm/px · 1 of 60 slices shown (5 of 6)]
[im 1/60]
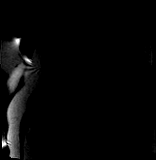

[Series 30: stress short axis · oblique · 8.0mm · 2.25mm/px · 1 of 60 slices shown (6 of 6)]
[im 1/60]
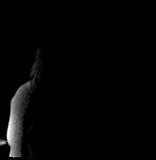

[Series 31: bSSFP · oblique · 8.0mm · 1.61mm/px · 1 of 25 slices shown (4 of 20)]
[im 1/25]
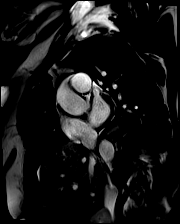

[Series 32: bSSFP · oblique · 8.0mm · 1.61mm/px · 1 of 25 slices shown (5 of 20)]
[im 1/25]
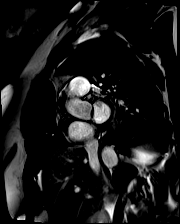

[Series 33: bSSFP · oblique · 8.0mm · 1.61mm/px · 1 of 25 slices shown (6 of 20)]
[im 1/25]
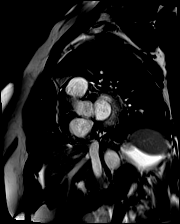

[Series 34: bSSFP · oblique · 8.0mm · 1.61mm/px · 1 of 25 slices shown (7 of 20)]
[im 1/25]
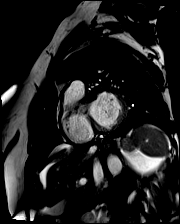

[Series 35: bSSFP · oblique · 8.0mm · 1.61mm/px · 1 of 25 slices shown (8 of 20)]
[im 1/25]
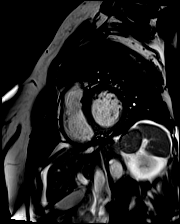

[Series 36: bSSFP · oblique · 8.0mm · 1.61mm/px · 1 of 25 slices shown (9 of 20)]
[im 1/25]
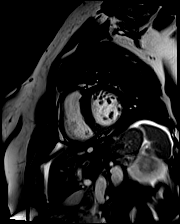

[Series 37: bSSFP · oblique · 8.0mm · 1.61mm/px · 1 of 25 slices shown (10 of 20)]
[im 1/25]
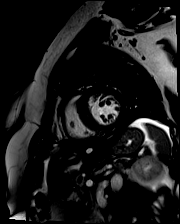

[Series 38: bSSFP · oblique · 8.0mm · 1.61mm/px · 1 of 25 slices shown (11 of 20)]
[im 1/25]
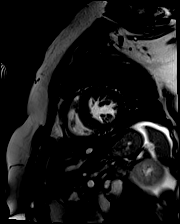

[Series 39: bSSFP · oblique · 8.0mm · 1.61mm/px · 1 of 25 slices shown (12 of 20)]
[im 1/25]
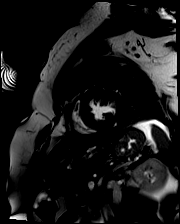

[Series 40: bSSFP · oblique · 8.0mm · 1.61mm/px · 1 of 25 slices shown (13 of 20)]
[im 1/25]
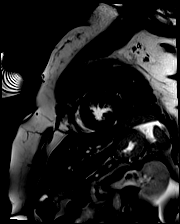

[Series 41: bSSFP · oblique · 8.0mm · 1.61mm/px · 1 of 25 slices shown (14 of 20)]
[im 1/25]
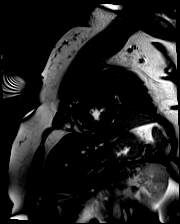

[Series 42: bSSFP · oblique · 8.0mm · 1.61mm/px · 1 of 25 slices shown (15 of 20)]
[im 1/25]
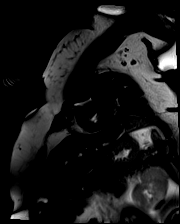

[Series 43: bSSFP · oblique · 8.0mm · 1.61mm/px · 1 of 25 slices shown (16 of 20)]
[im 1/25]
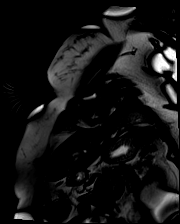

[Series 44: bSSFP · oblique · 8.0mm · 1.61mm/px · 1 of 25 slices shown (17 of 20)]
[im 1/25]
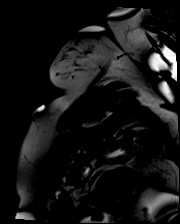

[Series 45: bSSFP · oblique · 8.0mm · 1.61mm/px · 1 of 25 slices shown (18 of 20)]
[im 1/25]
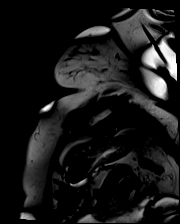

[Series 46: bSSFP · oblique · 8.0mm · 1.61mm/px · 1 of 25 slices shown (19 of 20)]
[im 1/25]
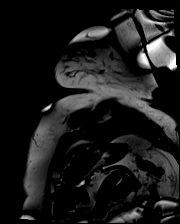

[Series 47: rest short axis · oblique · 8.0mm · 2.50mm/px · 1 of 60 slices shown (1 of 6)]
[im 1/60]
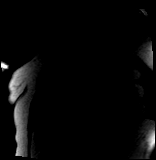

[Series 48: rest short axis · oblique · 8.0mm · 2.50mm/px · 1 of 60 slices shown (2 of 6)]
[im 1/60]
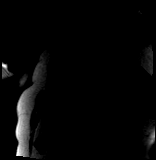

[Series 49: rest short axis · oblique · 8.0mm · 2.50mm/px · 1 of 60 slices shown (3 of 6)]
[im 1/60]
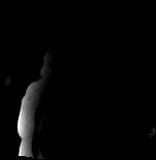

[Series 50: rest short axis · oblique · 8.0mm · 2.50mm/px · 1 of 60 slices shown (4 of 6)]
[im 1/60]
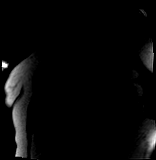

[Series 51: rest short axis · oblique · 8.0mm · 2.50mm/px · 1 of 60 slices shown (5 of 6)]
[im 1/60]
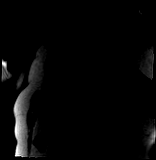

[Series 52: rest short axis · oblique · 8.0mm · 2.50mm/px · 1 of 60 slices shown (6 of 6)]
[im 1/60]
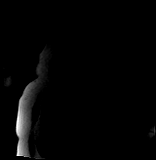

[Series 53: bSSFP · coronal · 6.0mm · 1.41mm/px · 1 of 25 slices shown (20 of 20)]
[im 1/25]
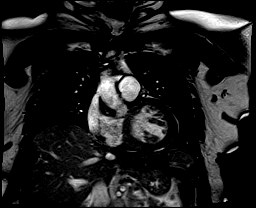

[45 of 48 positions shown; findings below may reference images not displayed]

EXAM:
CARDIAC MRI

PROCEDURE:
PROCEDURE
The patient was scanned on a 1.5 Tesla Siemens magnet. A dedicated
cardiac coil was used. Functional imaging was done using [employer]
sequences. [DATE], and 4 chamber views were done to assess for RWMA's.
Modified Cheney rule using a short axis stack was used to
calculate an ejection fraction on a dedicated work station using

Circle software. Rest and stress (following administration of
regadenoson 0.4mg) first-pass contrast-enhanced imaging was done.
The patient received 12 cc of Gadavist. After 10 minutes inversion
recovery sequences were used to assess for infiltration and scar
tissue.

CONTRAST:  12 cc  of Gadavist
FINDINGS: Left ventricle:

-Normal size

-Hyperdynamic systolic function

-Asymmetric hypertrophy measuring up to 20mm in apical anterior wall
(7mm in basal posterior wall)

-ECV mildy elevated (30%)

-Diffuse apical stress perfusion defect

-Patchy LGE at apex.  LGE accounts for <1% total myocardial mass

LV EF:  71% (Normal 56-78%)

Absolute volumes:

LV EDV: 119mL (Normal 52-141 mL)

LV ESV: 35mL (Normal 13-51 mL)

LV SV: 84mL (Normal 33-97 mL)

CO: 7.0L/min (Normal 2.7-6.0 L/min)

Indexed volumes:

LV EDV: 57mL/sq-m (Normal 41-81 mL/sq-m)

LV ESV: 17mL/sq-m (Normal 12-21 mL/sq-m)

LV SV: 40mL/sq-m (Normal 26-56 mL/sq-m)

CI: 3.4L/min/sq-m (Normal 1.8-3.8 L/min/sq-m)

Right ventricle: Normal size and hyperdynamic systolic function

RV EF: 77% (Normal 47-80%)

Absolute volumes:

RV EDV: 92mL (Normal 58-154 mL)

RV ESV: 21mL (Normal 12-68 mL)

RV SV: 71mL (Normal 35-98 mL)

CO: 6.0L/min (Normal 2.7-6 L/min)

Indexed volumes:

RV EDV: 44mL/sq-m (Normal 48-87 mL/sq-m)

RV ESV: 10mL/sq-m (Normal 11-28 mL/sq-m)

RV SV: 34mL/sq-m (Normal 27-57 mL/sq-m)

CI: 2.8L/min/sq-m (Normal 1.8-3.8 L/min/sq-m)

Left atrium: Mild enlargement

Right atrium: Normal size

Mitral valve: Trivial regurgitation

Aortic valve: No regurgitation

Tricuspid valve: Trivial regurgitation

Pulmonic valve: No regurgitation

Aorta: Mb Bozlu dilatation of ascending aorta measuring 38mm

Coronary arteries: Normal origins

Pericardium: Normal
IMPRESSION: 1. Asymmetric LV hypertrophy measuring up to 20mm in apical anterior
wall (7mm in basal posterior wall), consistent with apical
hypertrophic cardiomyopathy

2. Patchy late gadolinium enhancement at LV apex consistent with
apical HCM. LGE accounts for less than 1% of total myocardial mass

3. Apical stress perfusion defect consistent with ischemia. However
apical stress perfusion defects can be seen in apical HCM in the
absence of obstructive CAD. Would consider coronary CTA to rule out
obstructive CAD

4.  Normal LV size and hyperdynamic systolic function (EF 71%)

5.  Normal RV size and hyperdynamic systolic function (EF 77%)

## 2022-07-11 ENCOUNTER — Other Ambulatory Visit (HOSPITAL_BASED_OUTPATIENT_CLINIC_OR_DEPARTMENT_OTHER): Payer: Self-pay | Admitting: Cardiology

## 2022-07-11 DIAGNOSIS — I422 Other hypertrophic cardiomyopathy: Secondary | ICD-10-CM

## 2022-07-11 NOTE — Telephone Encounter (Signed)
Rx(s) sent to pharmacy electronically.  

## 2022-10-13 ENCOUNTER — Ambulatory Visit (HOSPITAL_BASED_OUTPATIENT_CLINIC_OR_DEPARTMENT_OTHER): Payer: Medicare HMO | Admitting: Cardiology

## 2022-11-04 ENCOUNTER — Ambulatory Visit (HOSPITAL_BASED_OUTPATIENT_CLINIC_OR_DEPARTMENT_OTHER): Payer: Medicare HMO | Admitting: Cardiology

## 2022-11-04 ENCOUNTER — Encounter (HOSPITAL_BASED_OUTPATIENT_CLINIC_OR_DEPARTMENT_OTHER): Payer: Self-pay | Admitting: Cardiology

## 2022-11-04 VITALS — BP 138/84 | HR 77 | Ht 60.0 in | Wt 224.4 lb

## 2022-11-04 DIAGNOSIS — I422 Other hypertrophic cardiomyopathy: Secondary | ICD-10-CM | POA: Diagnosis not present

## 2022-11-04 DIAGNOSIS — R079 Chest pain, unspecified: Secondary | ICD-10-CM | POA: Diagnosis not present

## 2022-11-04 DIAGNOSIS — R0609 Other forms of dyspnea: Secondary | ICD-10-CM

## 2022-11-04 DIAGNOSIS — I10 Essential (primary) hypertension: Secondary | ICD-10-CM | POA: Diagnosis not present

## 2022-11-04 MED ORDER — METOPROLOL SUCCINATE ER 50 MG PO TB24: 50.0000 mg | ORAL_TABLET | Freq: Every day | ORAL | 3 refills | Status: DC

## 2022-11-04 NOTE — Progress Notes (Unsigned)
Cardiology Office Note:    Date:  11/05/2022   ID:  Yolanda Lang, DOB May 24, 1946, MRN [id number]  PCP:  Sonia Side., FNP  Cardiologist:  Buford Dresser, MD  Referring MD: Sonia Side., FNP   CC: follow up  History of Present Illness:    Yolanda Lang is a 76 y.o. female with a hx of apical variant hypertrophic cardiomyopathy, hypertension, OSA on BiPAP who is seen for follow up. I initially met her 10/01/2020 as a new consult at the request of Sonia Side., FNP for the evaluation and management of possible history of heart failure.  CV history: Moved to this area from [place]. Had a cardiologist in [place] for years. Saw Dr. Stark Klein, [phone]. [place]. Unable to find through Care Everywhere. Dr. Stark Klein managed her blood pressure for her.   In 2015, was seeing a pulmonologist, had a sleep study done. Uses BiPAP every night since that time. Was told by her cardiologist that she had the beginnings of CHF. Later told that this may have been inaccurate.   Family history: mother had COPD. Father had 2 strokes, died of MI at age 60. Sister deceased age 62, had a bad heart. She is the oldest of 10 children, only 6 still alive. Sister (living, 3 years younger) on dialysis, has a bad heart.   Today: Has been having chest discomfort for the last two weeks. Husband was in the hospital 10/9-10/19, then was in physical therapy/rehab for several weeks. He has been home now for several weeks and his physical rehab has worsened; this has been very stressful for her.   Discomfort has been on the right side of the sternum and under her breast. The pain under her breast resolved with putting topical cream under her breast, but the discomfort on the right side of her chest has persisted. Pain is not constant, can be 2-3 times/day, related to exertion, each episode lasts until she rests. Nonradiating, no associated symptoms.  Denies shortness of  breath at rest. No PND, orthopnea, LE edema or unexpected weight gain. No syncope.    Past Medical History:  Diagnosis Date   Arthritis    Cataracts, bilateral    CHF (congestive heart failure) (HCC)    Decreased vision    Diverticulosis    Forgetfulness    GERD (gastroesophageal reflux disease)    GI bleed    Hypertension    IBS (irritable bowel syndrome)    MVP (mitral valve prolapse)    OSA (obstructive sleep apnea)    Prediabetes    Psoriasis    Sleep apnea    BIPAP    Past Surgical History:  Procedure Laterality Date   APPENDECTOMY     BIOPSY  11/01/2019   Procedure: BIOPSY;  Surgeon: Doran Stabler, MD;  Location: [place];  Service: Gastroenterology;;   COLONOSCOPY WITH PROPOFOL N/A 11/01/2019   Procedure: COLONOSCOPY WITH PROPOFOL;  Surgeon: Doran Stabler, MD;  Location: [place];  Service: Gastroenterology;  Laterality: N/A;   ESOPHAGOGASTRODUODENOSCOPY (EGD) WITH PROPOFOL N/A 11/01/2019   Procedure: ESOPHAGOGASTRODUODENOSCOPY (EGD) WITH PROPOFOL;  Surgeon: Doran Stabler, MD;  Location: [place];  Service: Gastroenterology;  Laterality: N/A;   FOOT SURGERY Right    right foot joint removal    PARTIAL HYSTERECTOMY  2008   partial   POLYPECTOMY  11/01/2019   Procedure: POLYPECTOMY;  Surgeon: Doran Stabler, MD;  Location: [place];  Service: Gastroenterology;;    Current Medications: Current Outpatient Medications on File Prior to Visit  Medication Sig   albuterol (VENTOLIN HFA) 108 (90 Base) MCG/ACT inhaler SMARTSIG:1-2 Puff(s) Via Inhaler Every 6 Hours PRN   amLODipine (NORVASC) 5 MG tablet Take 5 mg by mouth daily.   azelastine (ASTELIN) 0.1 % nasal spray Place 1 spray into both nostrils 2 (two) times daily.   budesonide-formoterol (SYMBICORT) 80-4.5 MCG/ACT inhaler SMARTSIG:2 Puff(s) By Mouth Morning-Evening   calcipotriene (DOVONOX) 0.005 % cream Apply 1 application topically in the morning and at bedtime.   cetirizine  (ZYRTEC) 10 MG tablet Take 10 mg by mouth daily.   clotrimazole-betamethasone (LOTRISONE) cream Apply 1 application topically 2 (two) times daily as needed (for rash).   diclofenac Sodium (VOLTAREN) 1 % GEL Apply 2 g topically 4 (four) times daily.   diphenoxylate-atropine (LOMOTIL) 2.5-0.025 MG tablet TAKE 1 TO 2 TABLETS BY MOUTH TWICE DAILY AS NEEDED FOR DIARRHEA OR LOOSE STOOLS   donepezil (ARICEPT) 23 MG TABS tablet Take 23 mg by mouth at bedtime.   furosemide (LASIX) 40 MG tablet Take 40 mg by mouth daily.   montelukast (SINGULAIR) 10 MG tablet Take 10 mg by mouth daily.   oxymetazoline (AFRIN) 0.05 % nasal spray Place 2 sprays into both nostrils 2 (two) times daily.   pantoprazole (PROTONIX) 40 MG tablet Take 40 mg by mouth 2 (two) times daily.   valsartan (DIOVAN) 320 MG tablet Take 320 mg by mouth daily.   No current facility-administered medications on file prior to visit.     Allergies:   Aspirin and Other   Social History   Tobacco Use   Smoking status: Former    Types: Cigarettes    Quit date: 05/08/1985    Years since quitting: 37.5   Smokeless tobacco: Never   Tobacco comments:    quit in 1975  Vaping Use   Vaping Use: Never used  Substance Use Topics   Alcohol use: Never   Drug use: Never    Family History: family history includes COPD in her mother; Diabetes in her maternal grandmother, sister, and sister; Heart attack in her father; Heart disease in her father; Hypertension in her daughter, daughter, father, and mother; Pancreatic disease in her brother; Stroke in her father.  ROS:   Please see the history of present illness.   Additional pertinent ROS otherwise unremarkable.  EKGs/Labs/Other Studies Reviewed:    The following studies were reviewed today:  Cardiac MRI 06/14/2021: IMPRESSION: 1. Asymmetric LV hypertrophy measuring up to 66m in apical anterior wall (740min basal posterior wall), consistent with apical hypertrophic cardiomyopathy   2.  Patchy late gadolinium enhancement at LV apex consistent with apical HCM. LGE accounts for less than 1% of total myocardial mass   3. Apical stress perfusion defect consistent with ischemia. However apical stress perfusion defects can be seen in apical HCM in the absence of obstructive CAD. Would consider coronary CTA to rule out obstructive CAD   4.  Normal LV size and hyperdynamic systolic function (EF 7115%  5.  Normal RV size and hyperdynamic systolic function (EF 7752% Echo 04/23/21 1. Left ventricular ejection fraction, by estimation, is 65 to 70%. The  left ventricle has normal function. The left ventricle has no regional  wall motion abnormalities. There is severe left ventricular hypertrophy.  Left ventricular diastolic parameters   are consistent with Grade II diastolic dysfunction (pseudonormalization).  Elevated left atrial pressure.   2. Right ventricular systolic function  is normal. The right ventricular  size is normal. There is moderately elevated pulmonary artery systolic  pressure. The estimated right ventricular systolic pressure is 66.0 mmHg.   3. The mitral valve is normal in structure. Trivial mitral valve  regurgitation.   4. The aortic valve was not well visualized. Aortic valve regurgitation  is not visualized. No aortic stenosis is present.   5. Aortic dilatation noted. There is mild dilatation of the ascending  aorta, measuring 38 mm.   6. Left atrial size was mildly dilated.   7. The inferior vena cava is normal in size with greater than 50%  respiratory variability, suggesting right atrial pressure of 3 mmHg.   8. Severe LVH, most pronounced at the apex with cavity obliteration in  systole. 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 Recommend  cardiac MRI for further evaluation  EKG:  EKG is personally reviewed.   05/09/22: SR at 60 bpm, LVH 06/14/21: SR at 65 bpm, LVH with repol 10/01/20: SR at 66 bpm with a PVC. Nonspecific ST-T wave  abnormality. Appears in a different pattern than ECG from 10/30/19  Recent Labs: No results found for requested labs within last 365 days.  Recent Lipid Panel No results found for: "CHOL", "TRIG", "HDL", "CHOLHDL", "VLDL", "LDLCALC", "LDLDIRECT"  Physical Exam:    VS:  BP 138/84   Pulse 77   Ht 5' (1.524 m)   Wt 224 lb 6.4 oz (101.8 kg)   SpO2 94%   BMI 43.83 kg/m     Wt Readings from Last 3 Encounters:  11/04/22 224 lb 6.4 oz (101.8 kg)  05/09/22 240 lb 4.8 oz (109 kg)  10/08/21 231 lb 9.6 oz (105.1 kg)    GEN: Well nourished, well developed in no acute distress HEENT: Normal, moist mucous membranes NECK: No JVD CARDIAC: regular rhythm, normal S1 and S2, no rubs or gallops. No murmur. VASCULAR: Radial and DP pulses 2+ bilaterally. No carotid bruits RESPIRATORY:  Clear to auscultation without rales, wheezing or rhonchi  ABDOMEN: Soft, non-tender, non-distended MUSCULOSKELETAL:  Ambulates independently SKIN: Warm and dry, no edema NEUROLOGIC:  Alert and oriented x 3. No focal neuro deficits noted. PSYCHIATRIC:  Normal affect    ASSESSMENT:    1. Chest pain of uncertain etiology   2. Apical variant hypertrophic cardiomyopathy ([place])   3. Primary hypertension   4. DOE (dyspnea on exertion)    PLAN:    Chest pain -primarily exertional. Focal pain. Has been caring for her husband, including pushing him in his wheelchair. Could be MSK. Not tender on exam. Does have apical HCM and possible apical ischemia. Will trial to uptitrate metoprolol to see if that helps. Given instructions on red flag warning signs.  -we discussed further testing, would prefer CT coronary, if pain persists. She will contact me  Dyspnea on exertion Apical hypertrophic cardiomyopathy on echo -see echo and stress cardiac MRI results -supports Apical HCM. We did review that an anatomic test such as CT or cath, but she wishes to hold on further testing at this time as above -tolerating metoprolol,  increasing as above  Hypertension: -longstanding, has been on medication since 1978 -continue valsartan 320 mg daily, furosemide 40 mg daily, amlodipine 5 mg daily -increasing metoprolol succinate today  Cardiac risk counseling and prevention recommendations: -recommend heart healthy/Mediterranean diet, with whole grains, fruits, vegetable, fish, lean meats, nuts, and olive oil. Limit salt. -recommend moderate walking, 3-5 times/week for 30-50 minutes each session. Aim for at least 150 minutes.week. Goal should be pace  of 3 miles/hours, or walking 1.5 miles in 30 minutes -recommend avoidance of tobacco products. Avoid excess alcohol. -would not start aspirin given history of lower GI bleed/diverticulosis unless significant CAD noted  Plan for follow up: 6-8 weeks   Buford Dresser, MD, PhD, [facility]    Medication Adjustments/Labs and Tests Ordered: Current medicines are reviewed at length with the patient today.  Concerns regarding medicines are outlined above.   No orders of the defined types were placed in this encounter.  Meds ordered this encounter  Medications   metoprolol succinate (TOPROL-XL) 50 MG 24 hr tablet    Sig: Take 1 tablet (50 mg total) by mouth daily.    Dispense:  90 tablet    Refill:  3   Patient Instructions  Medication Instructions:  Increase Metoprolol to 50 mg daily   *If you need a refill on your cardiac medications before your next appointment, please call your pharmacy*  Follow-Up: At [facility], you and your health needs are our priority.  As part of our continuing mission to provide you with exceptional heart care, we have created designated Provider Care Teams.  These Care Teams include your primary Cardiologist (physician) and Advanced Practice Providers (APPs -  Physician Assistants and Nurse Practitioners) who all work together to provide you with the care you need, when you need it.  We recommend  signing up for the patient portal called "MyChart".  Sign up information is provided on this After Visit Summary.  MyChart is used to connect with patients for Virtual Visits (Telemedicine).  Patients are able to view lab/test results, encounter notes, upcoming appointments, etc.  Non-urgent messages can be sent to your provider as well.   To learn more about what you can do with MyChart, go to [email or web address].    Your next appointment:   6 week(s)  The format for your next appointment:   In Person  Provider:   Buford Dresser, MD     We are going to increase the metoprolol today to see if this helps with the chest discomfort. If the discomfort is severe and sustained, call 911. If the discomfort stays the same or gets better, we will follow up in about 6 weeks. If the discomfort gets worse, call me and we will go forward with the stress test.    Signed, Buford Dresser, MD PhD 11/05/2022     [place]

## 2022-11-04 NOTE — Patient Instructions (Signed)
Medication Instructions:  Increase Metoprolol to 50 mg daily   *If you need a refill on your cardiac medications before your next appointment, please call your pharmacy*  Follow-Up: At [facility], you and your health needs are our priority.  As part of our continuing mission to provide you with exceptional heart care, we have created designated Provider Care Teams.  These Care Teams include your primary Cardiologist (physician) and Advanced Practice Providers (APPs -  Physician Assistants and Nurse Practitioners) who all work together to provide you with the care you need, when you need it.  We recommend signing up for the patient portal called "MyChart".  Sign up information is provided on this After Visit Summary.  MyChart is used to connect with patients for Virtual Visits (Telemedicine).  Patients are able to view lab/test results, encounter notes, upcoming appointments, etc.  Non-urgent messages can be sent to your provider as well.   To learn more about what you can do with MyChart, go to [email or web address].    Your next appointment:   6 week(s)  The format for your next appointment:   In Person  Provider:   Buford Dresser, MD     We are going to increase the metoprolol today to see if this helps with the chest discomfort. If the discomfort is severe and sustained, call 911. If the discomfort stays the same or gets better, we will follow up in about 6 weeks. If the discomfort gets worse, call me and we will go forward with the stress test.

## 2022-11-05 ENCOUNTER — Encounter (HOSPITAL_BASED_OUTPATIENT_CLINIC_OR_DEPARTMENT_OTHER): Payer: Self-pay | Admitting: Cardiology

## 2022-12-10 NOTE — Progress Notes (Signed)
Cardiology Office Note:    Date:  12/18/2022   ID:  Yolanda Lang, DOB 12/21/1945, MRN [id number]  PCP:  Yolanda Side., FNP  Cardiologist:  Yolanda Dresser, MD  Referring MD: Yolanda Side., FNP   CC: follow up  History of Present Illness:    Yolanda Lang is a 76 y.o. female with a hx of apical variant hypertrophic cardiomyopathy, hypertension, OSA on BiPAP who is seen for follow up. I initially met her 10/01/2020 as a new consult at the request of Yolanda Side., FNP for the evaluation and management of possible history of heart failure.  CV history: Moved to this area from [place]. Had a cardiologist in [place] for years. Saw Dr. Stark Lang, [phone]. [place]. Unable to find through Care Everywhere. Dr. Stark Lang managed her blood pressure for her.   In 2015, was seeing a pulmonologist, had a sleep study done. Uses BiPAP every night since that time. Was told by her cardiologist that she had the beginnings of CHF. Later told that this may have been inaccurate.   Family history: mother had COPD. Father had 2 strokes, died of MI at age 81. Sister deceased age 35, had a bad heart. She is the oldest of 10 children, only 6 still alive. Sister (living, 3 years younger) on dialysis, has a bad heart.   At his last appointment she complained of chest discomfort for 2 weeks. Discomfort had been on the right side of the sternum and under her breast. The pain under her breast resolved with using topical cream, but the discomfort on the right side of her chest persisted. Pain is not constant, can be 2-3 times/day, related to exertion, each episode lasts until she rests. Nonradiating, no associated symptoms. She noted that her husband was in the hospital 10/9-10/19, then was in physical therapy/rehab for several weeks. We increased metoprolol succinate to 50 mg once daily.  Today, she states that she is feeling well from a cardiovascular perspective. No  more episodes of chest discomfort. At this time she complains of back pain.   She continues to care for her husband, who had returned to the hospital and is now in physical therapy. She tries to keep her stress levels as low as possible.  Occasional flares of chronic sinus issues.  She denies any palpitations, shortness of breath, or peripheral edema. No lightheadedness, headaches, syncope, orthopnea, or PND.   Past Medical History:  Diagnosis Date   Arthritis    Cataracts, bilateral    CHF (congestive heart failure) (HCC)    Decreased vision    Diverticulosis    Forgetfulness    GERD (gastroesophageal reflux disease)    GI bleed    Hypertension    IBS (irritable bowel syndrome)    MVP (mitral valve prolapse)    OSA (obstructive sleep apnea)    Prediabetes    Psoriasis    Sleep apnea    BIPAP    Past Surgical History:  Procedure Laterality Date   APPENDECTOMY     BIOPSY  11/01/2019   Procedure: BIOPSY;  Surgeon: Doran Stabler, MD;  Location: [place];  Service: Gastroenterology;;   COLONOSCOPY WITH PROPOFOL N/A 11/01/2019   Procedure: COLONOSCOPY WITH PROPOFOL;  Surgeon: Doran Stabler, MD;  Location: [place];  Service: Gastroenterology;  Laterality: N/A;   ESOPHAGOGASTRODUODENOSCOPY (EGD) WITH PROPOFOL N/A 11/01/2019   Procedure: ESOPHAGOGASTRODUODENOSCOPY (EGD) WITH PROPOFOL;  Surgeon: Doran Stabler, MD;  Location: [facility];  Service: Gastroenterology;  Laterality: N/A;   FOOT SURGERY Right    right foot joint removal    PARTIAL HYSTERECTOMY  2008   partial   POLYPECTOMY  11/01/2019   Procedure: POLYPECTOMY;  Surgeon: Doran Stabler, MD;  Location: [place];  Service: Gastroenterology;;    Current Medications: Current Outpatient Medications on File Prior to Visit  Medication Sig   albuterol (VENTOLIN HFA) 108 (90 Base) MCG/ACT inhaler SMARTSIG:1-2 Puff(s) Via Inhaler Every 6 Hours PRN   amLODipine (NORVASC) 5 MG tablet Take 5 mg  by mouth daily.   azelastine (ASTELIN) 0.1 % nasal spray Place 1 spray into both nostrils 2 (two) times daily.   budesonide-formoterol (SYMBICORT) 80-4.5 MCG/ACT inhaler SMARTSIG:2 Puff(s) By Mouth Morning-Evening   calcipotriene (DOVONOX) 0.005 % cream Apply 1 application topically in the morning and at bedtime.   cetirizine (ZYRTEC) 10 MG tablet Take 10 mg by mouth daily.   clotrimazole-betamethasone (LOTRISONE) cream Apply 1 application topically 2 (two) times daily as needed (for rash).   diclofenac Sodium (VOLTAREN) 1 % GEL Apply 2 g topically 4 (four) times daily.   diphenoxylate-atropine (LOMOTIL) 2.5-0.025 MG tablet TAKE 1 TO 2 TABLETS BY MOUTH TWICE DAILY AS NEEDED FOR DIARRHEA OR LOOSE STOOLS   donepezil (ARICEPT) 23 MG TABS tablet Take 23 mg by mouth at bedtime.   furosemide (LASIX) 40 MG tablet Take 40 mg by mouth daily.   metoprolol succinate (TOPROL-XL) 50 MG 24 hr tablet Take 1 tablet (50 mg total) by mouth daily.   montelukast (SINGULAIR) 10 MG tablet Take 10 mg by mouth daily.   oxymetazoline (AFRIN) 0.05 % nasal spray Place 2 sprays into both nostrils 2 (two) times daily.   pantoprazole (PROTONIX) 40 MG tablet Take 40 mg by mouth 2 (two) times daily.   traMADol (ULTRAM) 50 MG tablet Take 50 mg by mouth every 8 (eight) hours as needed for moderate pain or severe pain.   valsartan (DIOVAN) 320 MG tablet Take 320 mg by mouth daily.   No current facility-administered medications on file prior to visit.     Allergies:   Aspirin and Other   Social History   Tobacco Use   Smoking status: Former    Types: Cigarettes    Quit date: 05/08/1985    Years since quitting: 37.6   Smokeless tobacco: Never   Tobacco comments:    quit in 1975  Vaping Use   Vaping Use: Never used  Substance Use Topics   Alcohol use: Never   Drug use: Never    Family History: family history includes COPD in her mother; Diabetes in her maternal grandmother, sister, and sister; Heart attack in her  father; Heart disease in her father; Hypertension in her daughter, daughter, father, and mother; Pancreatic disease in her brother; Stroke in her father.  ROS:   Please see the history of present illness.   (+) Back pain (+) Stress Additional pertinent ROS otherwise unremarkable.  EKGs/Labs/Other Studies Reviewed:    The following studies were reviewed today:  Cardiac MRI 06/14/2021: IMPRESSION: 1. Asymmetric LV hypertrophy measuring up to 66m in apical anterior wall (752min basal posterior wall), consistent with apical hypertrophic cardiomyopathy   2. Patchy late gadolinium enhancement at LV apex consistent with apical HCM. LGE accounts for less than 1% of total myocardial mass   3. Apical stress perfusion defect consistent with ischemia. However apical stress perfusion defects can be seen in apical HCM in the absence of obstructive  CAD. Would consider coronary CTA to rule out obstructive CAD   4.  Normal LV size and hyperdynamic systolic function (EF 123456)   5.  Normal RV size and hyperdynamic systolic function (EF A999333)  Echo 04/23/21 1. Left ventricular ejection fraction, by estimation, is 65 to 70%. The  left ventricle has normal function. The left ventricle has no regional  wall motion abnormalities. There is severe left ventricular hypertrophy.  Left ventricular diastolic parameters   are consistent with Grade II diastolic dysfunction (pseudonormalization).  Elevated left atrial pressure.   2. Right ventricular systolic function is normal. The right ventricular  size is normal. There is moderately elevated pulmonary artery systolic  pressure. The estimated right ventricular systolic pressure is [id number] mmHg.   3. The mitral valve is normal in structure. Trivial mitral valve  regurgitation.   4. The aortic valve was not well visualized. Aortic valve regurgitation  is not visualized. No aortic stenosis is present.   5. Aortic dilatation noted. There is mild dilatation of the  ascending  aorta, measuring 38 mm.   6. Left atrial size was mildly dilated.   7. The inferior vena cava is normal in size with greater than 50%  respiratory variability, suggesting right atrial pressure of 3 mmHg.   8. Severe LVH, most pronounced at the apex with cavity obliteration in  systole. 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 Recommend  cardiac MRI for further evaluation  EKG:  EKG is personally reviewed.   12/18/2022:  EKG was not ordered. 05/09/22: SR at 60 bpm, LVH 06/14/21: SR at 65 bpm, LVH with repol 10/01/20: SR at 66 bpm with a PVC. Nonspecific ST-T wave abnormality. Appears in a different pattern than ECG from 10/30/19  Recent Labs: No results found for requested labs within last 365 days.   Recent Lipid Panel No results found for: "CHOL", "TRIG", "HDL", "CHOLHDL", "VLDL", "LDLCALC", "LDLDIRECT"  Physical Exam:    VS:  BP 138/80 (BP Location: Left Arm, Patient Position: Sitting, Cuff Size: Large)   Pulse (!) 53   Ht 5' (1.524 m)   Wt 224 lb 6.4 oz (101.8 kg)   SpO2 96%   BMI 43.83 kg/m     Wt Readings from Last 3 Encounters:  12/18/22 224 lb 6.4 oz (101.8 kg)  11/04/22 224 lb 6.4 oz (101.8 kg)  05/09/22 240 lb 4.8 oz (109 kg)    GEN: Well nourished, well developed in no acute distress HEENT: Normal, moist mucous membranes NECK: No JVD CARDIAC: regular rhythm, normal S1 and S2, no rubs or gallops. 1/6 soft systolic murmur. VASCULAR: Radial and DP pulses 2+ bilaterally. No carotid bruits RESPIRATORY:  Clear to auscultation without rales, wheezing or rhonchi  ABDOMEN: Soft, non-tender, non-distended MUSCULOSKELETAL:  Ambulates independently SKIN: Warm and dry, no edema NEUROLOGIC:  Alert and oriented x 3. No focal neuro deficits noted. PSYCHIATRIC:  Normal affect    ASSESSMENT:    1. Chest pain of uncertain etiology   2. Apical variant hypertrophic cardiomyopathy ([place])   3. Primary hypertension   4. DOE (dyspnea on exertion)   5. Cardiac  risk counseling     PLAN:    Chest pain -improved, atypical in nature. She will contact me if this recurs  Dyspnea on exertion Apical hypertrophic cardiomyopathy on echo -see echo and stress cardiac MRI results -supports Apical HCM. We did review that an anatomic test such as CT or cath, but she wishes to hold on further testing at this time -tolerating metoprolol  Hypertension: -  longstanding, has been on medication since 1978 -continue valsartan 320 mg daily, furosemide 40 mg daily, amlodipine 5 mg daily, metoprolol succinate 50 mg daily  Cardiac risk counseling and prevention recommendations: -recommend heart healthy/Mediterranean diet, with whole grains, fruits, vegetable, fish, lean meats, nuts, and olive oil. Limit salt. -recommend moderate walking, 3-5 times/week for 30-50 minutes each session. Aim for at least 150 minutes.week. Goal should be pace of 3 miles/hours, or walking 1.5 miles in 30 minutes -recommend avoidance of tobacco products. Avoid excess alcohol. -would not start aspirin given history of lower GI bleed/diverticulosis unless significant CAD noted  Plan for follow up: 3 months or sooner as needed.  Yolanda Dresser, MD, PhD, [facility]    Medication Adjustments/Labs and Tests Ordered: Current medicines are reviewed at length with the patient today.  Concerns regarding medicines are outlined above.   No orders of the defined types were placed in this encounter.  No orders of the defined types were placed in this encounter.  Patient Instructions  Medication Instructions:  Your physician recommends that you continue on your current medications as directed. Please refer to the Current Medication list given to you today.   *If you need a refill on your cardiac medications before your next appointment, please call your pharmacy*  Lab Work: NONE  Testing/Procedures: NONE  Follow-Up: At [facility], you and your  health needs are our priority.  As part of our continuing mission to provide you with exceptional heart care, we have created designated Provider Care Teams.  These Care Teams include your primary Cardiologist (physician) and Advanced Practice Providers (APPs -  Physician Assistants and Nurse Practitioners) who all work together to provide you with the care you need, when you need it.  We recommend signing up for the patient portal called "MyChart".  Sign up information is provided on this After Visit Summary.  MyChart is used to connect with patients for Virtual Visits (Telemedicine).  Patients are able to view lab/test results, encounter notes, upcoming appointments, etc.  Non-urgent messages can be sent to your provider as well.   To learn more about what you can do with MyChart, go to [email or web address].    Your next appointment:   3 month(s)  The format for your next appointment:   In Person  Provider:   Buford Dresser, MD            [facility],acting as a scribe for Yolanda Dresser, MD.,have documented all relevant documentation on the behalf of Yolanda Dresser, MD,as directed by  Yolanda Dresser, MD while in the presence of Yolanda Dresser, MD.  I, Yolanda Dresser, MD, have reviewed all documentation for this visit. The documentation on 02/01/23 for the exam, diagnosis, procedures, and orders are all accurate and complete.   Signed, Yolanda Dresser, MD PhD 12/18/2022     [place]

## 2022-12-18 ENCOUNTER — Ambulatory Visit (HOSPITAL_BASED_OUTPATIENT_CLINIC_OR_DEPARTMENT_OTHER): Payer: Medicare HMO | Admitting: Cardiology

## 2022-12-18 ENCOUNTER — Encounter (HOSPITAL_BASED_OUTPATIENT_CLINIC_OR_DEPARTMENT_OTHER): Payer: Self-pay | Admitting: Cardiology

## 2022-12-18 VITALS — BP 138/80 | HR 53 | Ht 60.0 in | Wt 224.4 lb

## 2022-12-18 DIAGNOSIS — R079 Chest pain, unspecified: Secondary | ICD-10-CM

## 2022-12-18 DIAGNOSIS — R0609 Other forms of dyspnea: Secondary | ICD-10-CM

## 2022-12-18 DIAGNOSIS — I10 Essential (primary) hypertension: Secondary | ICD-10-CM | POA: Diagnosis not present

## 2022-12-18 DIAGNOSIS — I422 Other hypertrophic cardiomyopathy: Secondary | ICD-10-CM | POA: Diagnosis not present

## 2022-12-18 DIAGNOSIS — Z7189 Other specified counseling: Secondary | ICD-10-CM

## 2022-12-18 NOTE — Patient Instructions (Signed)
Medication Instructions:  Your physician recommends that you continue on your current medications as directed. Please refer to the Current Medication list given to you today.  *If you need a refill on your cardiac medications before your next appointment, please call your pharmacy*  Lab Work: NONE  Testing/Procedures: NONE  Follow-Up: At [facility], you and your health needs are our priority.  As part of our continuing mission to provide you with exceptional heart care, we have created designated Provider Care Teams.  These Care Teams include your primary Cardiologist (physician) and Advanced Practice Providers (APPs -  Physician Assistants and Nurse Practitioners) who all work together to provide you with the care you need, when you need it.  We recommend signing up for the patient portal called "MyChart".  Sign up information is provided on this After Visit Summary.  MyChart is used to connect with patients for Virtual Visits (Telemedicine).  Patients are able to view lab/test results, encounter notes, upcoming appointments, etc.  Non-urgent messages can be sent to your provider as well.   To learn more about what you can do with MyChart, go to [email or web address].    Your next appointment:   3 month(s)  The format for your next appointment:   In Person  Provider:   Bridgette Christopher, MD     

## 2023-02-01 ENCOUNTER — Encounter (HOSPITAL_BASED_OUTPATIENT_CLINIC_OR_DEPARTMENT_OTHER): Payer: Self-pay | Admitting: Cardiology

## 2023-03-23 ENCOUNTER — Encounter (HOSPITAL_BASED_OUTPATIENT_CLINIC_OR_DEPARTMENT_OTHER): Payer: Self-pay | Admitting: Cardiology

## 2023-03-23 ENCOUNTER — Ambulatory Visit (HOSPITAL_BASED_OUTPATIENT_CLINIC_OR_DEPARTMENT_OTHER): Payer: Medicare HMO | Admitting: Cardiology

## 2023-03-23 VITALS — BP 134/76 | HR 61 | Ht 60.0 in | Wt 225.5 lb

## 2023-03-23 DIAGNOSIS — I10 Essential (primary) hypertension: Secondary | ICD-10-CM | POA: Diagnosis not present

## 2023-03-23 DIAGNOSIS — I422 Other hypertrophic cardiomyopathy: Secondary | ICD-10-CM

## 2023-03-23 DIAGNOSIS — Z7189 Other specified counseling: Secondary | ICD-10-CM | POA: Diagnosis not present

## 2023-03-23 DIAGNOSIS — R079 Chest pain, unspecified: Secondary | ICD-10-CM | POA: Diagnosis not present

## 2023-03-23 NOTE — Patient Instructions (Addendum)
Medication Instructions:  Your physician recommends that you continue on your current medications as directed. Please refer to the Current Medication list given to you today.  *If you need a refill on your cardiac medications before your next appointment, please call your pharmacy*  Lab Work: NONE  Testing/Procedures: NONE  Follow-Up: At [facility], you and your health needs are our priority.  As part of our continuing mission to provide you with exceptional heart care, we have created designated Provider Care Teams.  These Care Teams include your primary Cardiologist (physician) and Advanced Practice Providers (APPs -  Physician Assistants and Nurse Practitioners) who all work together to provide you with the care you need, when you need it.  We recommend signing up for the patient portal called "MyChart".  Sign up information is provided on this After Visit Summary.  MyChart is used to connect with patients for Virtual Visits (Telemedicine).  Patients are able to view lab/test results, encounter notes, upcoming appointments, etc.  Non-urgent messages can be sent to your provider as well.   To learn more about what you can do with MyChart, go to [email or web address].    Your next appointment:   6 month(s)  The format for your next appointment:   In Person  Provider:   Jodelle Red, MD       Information on dementia caregiver support groups: [email or web address] [email or web address].no

## 2023-03-23 NOTE — Progress Notes (Signed)
Cardiology Office Note:    Date:  03/23/2023   ID:  Yolanda Lang, DOB 06/26/1946, MRN [id number]  PCP:  Raymon MuttonSmith, Fred A Jr., FNP  Cardiologist:  Jodelle RedBridgette Strider Vallance, MD  Referring MD: Raymon MuttonSmith, Fred A Jr., FNP   CC: follow up  History of Present Illness:    Yolanda Connersnn Elwood is a 77 y.o. female with a hx of apical variant hypertrophic cardiomyopathy, hypertension, OSA on BiPAP who is seen for follow up. I initially met her 10/01/2020 as a new consult at the request of Raymon MuttonSmith, Fred A Jr., FNP for the evaluation and management of possible history of heart failure.  CV history: [place]oved [place] this area from [place]. Had a cardiologist in [place] for years. Saw Dr. Gaynelle AduZaman, [phone]. [facility], [place]. Unable to find through Care Everywhere. Dr. Gaynelle AduZaman managed her blood pressure for her.   In 2015, was seeing a pulmonologist, had a sleep study done. Uses BiPAP every night since that time. Was told by her cardiologist that she had the beginnings of CHF. Later told that this may have been inaccurate.   Family history: mother had COPD. Father had 2 strokes, died of MI at age 79. Sister deceased age 79, had a bad heart. She is the oldest of 10 children, only 6 still alive. Sister (living, 3 years younger) on dialysis, has a bad heart.   At her visit 10/2022, she complained of chest discomfort for 2 weeks. Discomfort had been on the right side of the sternum and under her breast. The pain under her breast resolved with using topical cream, but the discomfort on the right side of her chest persisted. Related to exertion, non-radiating, no associated symptoms. She noted that her husband was in the hospital 10/9-10/19, then was in physical therapy/rehab for several weeks. We increased metoprolol succinate to 50 mg once daily.  At her last appointment, she was doing well with no further episodes of chest discomfort. She continued to care for her husband, who had returned to the hospital and  was in physical therapy. She tried to keep her stress levels as low as possible.  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 She notes that he is able to do more than he could previously. She is interested in finding support groups for those who are caring for dementia patients.  Sometimes, she will experience a chest discomfort (not pain) which will improve with rest.  In clinic today her blood pressure is elevated to 157/91. Improved to 134/76 on recheck  She notes that she was almost out the door when she realized she hadn't taken her antihypertensives. She took them and rushed to our appointment. Additionally she endorses mild sinus issues today which is known to contribute to higher blood pressures for her.  She denies any palpitations, shortness of breath, or peripheral edema. No lightheadedness, headaches, syncope, orthopnea, or PND.   Past Medical History:  Diagnosis Date   Arthritis    Cataracts, bilateral    CHF (congestive heart failure)    Decreased vision    Diverticulosis    Forgetfulness    GERD (gastroesophageal reflux disease)    GI bleed    Hypertension    IBS (irritable bowel syndrome)    MVP (mitral valve prolapse)    OSA (obstructive sleep apnea)    Prediabetes    Psoriasis    Sleep apnea  BIPAP    Past Surgical History:  Procedure Laterality Date   APPENDECTOMY     BIOPSY  11/01/2019   Procedure: BIOPSY;  Surgeon: Sherrilyn Rist, MD;  Location: [facility] ENDOSCOPY;  Service: Gastroenterology;;   COLONOSCOPY WITH PROPOFOL N/A 11/01/2019   Procedure: COLONOSCOPY WITH PROPOFOL;  Surgeon: Sherrilyn Rist, MD;  Location: [facility] ENDOSCOPY;  Service: Gastroenterology;  Laterality: N/A;   ESOPHAGOGASTRODUODENOSCOPY (EGD) WITH PROPOFOL N/A 11/01/2019   Procedure: ESOPHAGOGASTRODUODENOSCOPY (EGD) WITH PROPOFOL;  Surgeon: Sherrilyn Rist, MD;  Location:  [facility] ENDOSCOPY;  Service: Gastroenterology;  Laterality: N/A;   FOOT SURGERY Right    right foot joint removal    PARTIAL HYSTERECTOMY  2008   partial   POLYPECTOMY  11/01/2019   Procedure: POLYPECTOMY;  Surgeon: Sherrilyn Rist, MD;  Location: [facility] ENDOSCOPY;  Service: Gastroenterology;;    Current Medications: Current Outpatient Medications on File Prior to Visit  Medication Sig   albuterol (VENTOLIN HFA) 108 (90 Base) MCG/ACT inhaler SMARTSIG:1-2 Puff(s) Via Inhaler Every 6 Hours PRN   amLODipine (NORVASC) 5 MG tablet Take 5 mg by mouth daily.   azelastine (ASTELIN) 0.1 % nasal spray Place 1 spray into both nostrils 2 (two) times daily.   budesonide-formoterol (SYMBICORT) 80-4.5 MCG/ACT inhaler SMARTSIG:2 Puff(s) By Mouth Morning-Evening   calcipotriene (DOVONOX) 0.005 % cream Apply 1 application topically in the morning and at bedtime.   cetirizine (ZYRTEC) 10 MG tablet Take 10 mg by mouth daily.   clotrimazole-betamethasone (LOTRISONE) cream Apply 1 application topically 2 (two) times daily as needed (for rash).   diclofenac Sodium (VOLTAREN) 1 % GEL Apply 2 g topically 4 (four) times daily.   diphenoxylate-atropine (LOMOTIL) 2.5-0.025 MG tablet TAKE 1 TO 2 TABLETS BY MOUTH TWICE DAILY AS NEEDED FOR DIARRHEA OR LOOSE STOOLS   donepezil (ARICEPT) 23 MG TABS tablet Take 23 mg by mouth at bedtime.   furosemide (LASIX) 40 MG tablet Take 40 mg by mouth daily.   metoprolol succinate (TOPROL-XL) 50 MG 24 hr tablet Take 1 tablet (50 mg total) by mouth daily.   montelukast (SINGULAIR) 10 MG tablet Take 10 mg by mouth daily.   oxymetazoline (AFRIN) 0.05 % nasal spray Place 2 sprays into both nostrils 2 (two) times daily.   pantoprazole (PROTONIX) 40 MG tablet Take 40 mg by mouth 2 (two) times daily.   traMADol (ULTRAM) 50 MG tablet Take 50 mg by mouth every 8 (eight) hours as needed for moderate pain or severe pain.   valsartan (DIOVAN) 320 MG tablet Take 320 mg by mouth daily.   No current  facility-administered medications on file prior to visit.     Allergies:   Aspirin and Other   Social History   Tobacco Use   Smoking status: Former    Types: Cigarettes    Quit date: 05/08/1985    Years since quitting: 37.8   Smokeless tobacco: Never   Tobacco comments:    quit in 1975  Vaping Use   Vaping Use: Never used  Substance Use Topics   Alcohol use: Never   Drug use: Never    Family History: family history includes COPD in her mother; Diabetes in her maternal grandmother, sister, and sister; Heart attack in her father; Heart disease in her father; Hypertension in her daughter, daughter, father, and mother; Pancreatic disease in her brother; Stroke in her father.  ROS:   Please see the history of present illness.   (+) Stress (+) Occasional chest discomfort (+) Mild  sinus congestion Additional pertinent ROS otherwise unremarkable.  EKGs/Labs/Other Studies Reviewed:    The following studies were reviewed today:  Cardiac MRI 06/14/2021: IMPRESSION: 1. Asymmetric LV hypertrophy measuring up to 20mm in apical anterior wall (7mm in basal posterior wall), consistent with apical hypertrophic cardiomyopathy   2. Patchy late gadolinium enhancement at LV apex consistent with apical HCM. LGE accounts for less than 1% of total myocardial mass   3. Apical stress perfusion defect consistent with ischemia. However apical stress perfusion defects can be seen in apical HCM in the absence of obstructive CAD. Would consider coronary CTA to rule out obstructive CAD   4.  Normal LV size and hyperdynamic systolic function (EF 71%)   5.  Normal RV size and hyperdynamic systolic function (EF 77%)  Echo 04/23/21 1. Left ventricular ejection fraction, by estimation, is 65 to 70%. The  left ventricle has normal function. The left ventricle has no regional  wall motion abnormalities. There is severe left ventricular hypertrophy.  Left ventricular diastolic parameters   are  consistent with Grade II diastolic dysfunction (pseudonormalization).  Elevated left atrial pressure.   2. Right ventricular systolic function is normal. The right ventricular  size is normal. There is moderately elevated pulmonary artery systolic  pressure. The estimated right ventricular systolic pressure is 50.1 mmHg.   3. The mitral valve is normal in structure. Trivial mitral valve  regurgitation.   4. The aortic valve was not well visualized. Aortic valve regurgitation  is not visualized. No aortic stenosis is present.   5. Aortic dilatation noted. There is mild dilatation of the ascending  aorta, measuring 38 mm.   6. Left atrial size was mildly dilated.   7. The inferior vena cava is normal in size with greater than 50%  respiratory variability, suggesting right atrial pressure of 3 mmHg.   8. Severe LVH, most pronounced at the apex with cavity obliteration in  systole. 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 Recommend  cardiac MRI for further evaluation  EKG:  EKG is personally reviewed.   03/23/2023:  EKG was not ordered. 12/18/2022:  EKG was not ordered. 05/09/22: SR at 60 bpm, LVH 06/14/21: SR at 65 bpm, LVH with repol 10/01/20: SR at 66 bpm with a PVC. Nonspecific ST-T wave abnormality. Appears in a different pattern than ECG from 10/30/19  Recent Labs: No results found for requested labs within last 365 days.   Recent Lipid Panel No results found for: "CHOL", "TRIG", "HDL", "CHOLHDL", "VLDL", "LDLCALC", "LDLDIRECT"  Physical Exam:    VS:  BP 134/76 (BP Location: Right Arm, Patient Position: Sitting, Cuff Size: Large)   Pulse 61   Ht 5' (1.524 m)   Wt 225 lb 8 oz (102.3 kg)   SpO2 94%   BMI 44.04 kg/m     Wt Readings from Last 3 Encounters:  03/23/23 225 lb 8 oz (102.3 kg)  12/18/22 224 lb 6.4 oz (101.8 kg)  11/04/22 224 lb 6.4 oz (101.8 kg)    GEN: Well nourished, well developed in no acute distress HEENT: Normal, moist mucous membranes NECK: No  JVD CARDIAC: regular rhythm, normal S1 and S2, no rubs or gallops. 1/6 soft systolic murmur. VASCULAR: Radial and DP pulses 2+ bilaterally. No carotid bruits RESPIRATORY:  Clear to auscultation without rales, wheezing or rhonchi  ABDOMEN: Soft, non-tender, non-distended MUSCULOSKELETAL:  Ambulates independently SKIN: Warm and dry, no edema NEUROLOGIC:  Alert and oriented x 3. No focal neuro deficits noted. PSYCHIATRIC:  Normal affect    ASSESSMENT:  1. Chest pain of uncertain etiology   2. Apical variant hypertrophic cardiomyopathy   3. Primary hypertension   4. Cardiac risk counseling   5. Counseling on health promotion and disease prevention     PLAN:    Chest pain -improved, atypical in nature. She will contact me if this worsens -reviewed red flag warning signs that need immediate medical attention  Dyspnea on exertion Apical hypertrophic cardiomyopathy on echo -see echo and stress cardiac MRI results -supports Apical HCM. We did review that an anatomic test such as CT or cath, but she wishes to hold on further testing at this time -tolerating metoprolol  Hypertension: -improved on recheck today -longstanding, has been on medication since 1978 -continue valsartan 320 mg daily, furosemide 40 mg daily, amlodipine 5 mg daily, metoprolol succinate 50 mg daily  Cardiac risk counseling and prevention recommendations: -recommend heart healthy/Mediterranean diet, with whole grains, fruits, vegetable, fish, lean meats, nuts, and olive oil. Limit salt. -recommend moderate walking, 3-5 times/week for 30-50 minutes each session. Aim for at least 150 minutes.week. Goal should be pace of 3 miles/hours, or walking 1.5 miles in 30 minutes -recommend avoidance of tobacco products. Avoid excess alcohol. -would not start aspirin given history of lower GI bleed/diverticulosis unless significant CAD noted  Plan for follow up: 6 months or sooner as needed.  Jodelle Red, MD, PhD,  [facility] [place]  [facility]    Medication Adjustments/Labs and Tests Ordered: Current medicines are reviewed at length with the patient today.  Concerns regarding medicines are outlined above.   No orders of the defined types were placed in this encounter.  No orders of the defined types were placed in this encounter.  Patient Instructions  Medication Instructions:  Your physician recommends that you continue on your current medications as directed. Please refer to the Current Medication list given to you today.  *If you need a refill on your cardiac medications before your next appointment, please call your pharmacy*  Lab Work: NONE  Testing/Procedures: NONE  Follow-Up: At [facility], you and your health needs are our priority.  As part of our continuing mission to provide you with exceptional heart care, we have created designated Provider Care Teams.  These Care Teams include your primary Cardiologist (physician) and Advanced Practice Providers (APPs -  Physician Assistants and Nurse Practitioners) who all work together to provide you with the care you need, when you need it.  We recommend signing up for the patient portal called "MyChart".  Sign up information is provided on this After Visit Summary.  MyChart is used to connect with patients for Virtual Visits (Telemedicine).  Patients are able to view lab/test results, encounter notes, upcoming appointments, etc.  Non-urgent messages can be sent to your provider as well.   To learn more about what you can do with MyChart, go to [email or web address].    Your next appointment:   6 month(s)  The format for your next appointment:   In Person  Provider:   Jodelle Red, MD       Information on dementia caregiver support groups: GroupJournal.fr [employer]TechnologyAnalyst.no   I,Mathew Stumpf,acting as a  scribe for Jodelle Red, MD.,have documented all relevant documentation on the behalf of Jodelle Red, MD,as directed by  Jodelle Red, MD while in the presence of Jodelle Red, MD.  I, Jodelle Red, MD, have reviewed all documentation for this visit. The documentation on 03/23/23 for the exam, diagnosis, procedures, and orders are all accurate and complete.   Signed,  Jodelle Red, MD PhD 03/23/2023     [facility]

## 2023-04-05 ENCOUNTER — Encounter (HOSPITAL_BASED_OUTPATIENT_CLINIC_OR_DEPARTMENT_OTHER): Payer: Self-pay | Admitting: Cardiology

## 2023-06-03 ENCOUNTER — Encounter (HOSPITAL_BASED_OUTPATIENT_CLINIC_OR_DEPARTMENT_OTHER): Payer: Self-pay | Admitting: Cardiology

## 2023-06-03 ENCOUNTER — Ambulatory Visit (HOSPITAL_BASED_OUTPATIENT_CLINIC_OR_DEPARTMENT_OTHER): Payer: Medicare HMO | Admitting: Cardiology

## 2023-06-03 VITALS — BP 136/80 | HR 64 | Ht 60.0 in | Wt 223.2 lb

## 2023-06-03 DIAGNOSIS — I10 Essential (primary) hypertension: Secondary | ICD-10-CM

## 2023-06-03 DIAGNOSIS — I422 Other hypertrophic cardiomyopathy: Secondary | ICD-10-CM | POA: Diagnosis not present

## 2023-06-03 DIAGNOSIS — R0789 Other chest pain: Secondary | ICD-10-CM | POA: Diagnosis not present

## 2023-06-03 DIAGNOSIS — R5383 Other fatigue: Secondary | ICD-10-CM | POA: Diagnosis not present

## 2023-06-03 NOTE — Patient Instructions (Signed)
Medication Instructions:  Your physician recommends that you continue on your current medications as directed. Please refer to the Current Medication list given to you today.  *If you need a refill on your cardiac medications before your next appointment, please call your pharmacy*  Lab Work: NONE  Testing/Procedures: NONE  Follow-Up: At [facility], you and your health needs are our priority.  As part of our continuing mission to provide you with exceptional heart care, we have created designated Provider Care Teams.  These Care Teams include your primary Cardiologist (physician) and Advanced Practice Providers (APPs -  Physician Assistants and Nurse Practitioners) who all work together to provide you with the care you need, when you need it.  We recommend signing up for the patient portal called "MyChart".  Sign up information is provided on this After Visit Summary.  MyChart is used to connect with patients for Virtual Visits (Telemedicine).  Patients are able to view lab/test results, encounter notes, upcoming appointments, etc.  Non-urgent messages can be sent to your provider as well.   To learn more about what you can do with MyChart, go to [email or web address].    Your next appointment:   IN OCTOBER   The format for your next appointment:   In Person  Provider:   Jodelle Red, MD OR Ronn Melena NP

## 2023-06-03 NOTE — Progress Notes (Signed)
Cardiology Office Note:  .   Date:  06/03/2023  ID:  Yolanda Lang, DOB 01/11/1946, MRN [id number] PCP: Raymon Mutton., FNP  [facility] Providers Cardiologist:  Jodelle Red, MD {  History of Present Illness: .   Yolanda Lang is a 77 y.o. female with a hx of apical variant hypertrophic cardiomyopathy, hypertension, OSA on BiPAP who is seen for follow up. I initially met her 10/01/2020 as a new consult at the request of Raymon Mutton., FNP for the evaluation and management of possible history of heart failure.   Today: Has been feeling terrible for the last few weeks. Felt very fatigued, had chest discomfort. Discomfort is left central, aching sensation. Nonradiating. Nothing seemed to make it better or worse except resting made it better. Also noted feeling weak in her legs. Started getting better two days ago. She attributed this to increased care of her husband. Symptoms improved when she started getting help three days/week. Also restarted her multivitamin and felt improved.  ROS: Denies shortness of breath at rest or with normal exertion. No PND, orthopnea, LE edema or unexpected weight gain. No syncope or palpitations. ROS otherwise negative except as noted.   Studies Reviewed: Marland Kitchen    EKG:  EKG Interpretation  Date/Time:  Wednesday June 03 2023 15:36:00 EDT Ventricular Rate:  64 PR Interval:  150 QRS Duration: 98 QT Interval:  462 QTC Calculation: 476 R Axis:   3 Text Interpretation: Normal sinus rhythm Possible Left atrial enlargement Left ventricular hypertrophy with repolarization abnormality When compared with ECG of 14-Jun-2021 11:22, No significant change was found Confirmed by Jodelle Red [phone]) on 06/03/2023 4:04:18 PM    Physical Exam:   VS:  BP 136/80 (BP Location: Left Arm, Patient Position: Sitting, Cuff Size: Large)   Pulse 64   Ht 5' (1.524 m)   Wt 223 lb 3.2 oz (101.2 kg)   BMI 43.59 kg/m    Wt Readings from Last 3 Encounters:   06/03/23 223 lb 3.2 oz (101.2 kg)  03/23/23 225 lb 8 oz (102.3 kg)  12/18/22 224 lb 6.4 oz (101.8 kg)    GEN: Well nourished, well developed in no acute distress HEENT: Normal, moist mucous membranes NECK: No JVD CARDIAC: regular rhythm, normal S1 and S2, no rubs or gallops. No murmur. VASCULAR: Radial and DP pulses 2+ bilaterally. No carotid bruits RESPIRATORY:  Clear to auscultation without rales, wheezing or rhonchi  ABDOMEN: Soft, non-tender, non-distended MUSCULOSKELETAL:  Ambulates independently SKIN: Warm and dry, no edema NEUROLOGIC:  Alert and oriented x 3. No focal neuro deficits noted. PSYCHIATRIC:  Normal affect    ASSESSMENT AND PLAN: .   Chest pain Fatigue -worsened recently with stress of caring for her husband, improved now that she has some help. -prior ischemic workup unremarkable -reviewed red flag warning signs that need immediate medical attention   Dyspnea on exertion Apical hypertrophic cardiomyopathy on echo -see echo and stress cardiac MRI results -supports Apical HCM. We did review that an anatomic test such as CT or cath, but she wishes to hold on further testing at this time -tolerating metoprolol   Hypertension: -longstanding, has been on medication since 1978 -continue valsartan 320 mg daily, furosemide 40 mg daily, amlodipine 5 mg daily, metoprolol succinate 50 mg daily  CV risk counseling and prevention -recommend heart healthy/Mediterranean diet, with whole grains, fruits, vegetable, fish, lean meats, nuts, and olive oil. Limit salt. -recommend moderate walking, 3-5 times/week for 30-50 minutes each session. Aim for at  least 150 minutes.week. Goal should be pace of 3 miles/hours, or walking 1.5 miles in 30 minutes -recommend avoidance of tobacco products. Avoid excess alcohol. -would not start aspirin given history of lower GI bleed/diverticulosis unless significant CAD noted   Dispo: 4 mos (6 mos from prior scheduled  visit)  Signed, Jodelle Red, MD   Jodelle Red, MD, PhD, [facility] [place]  [facility]    Heart & Vascular at [facility] at [facility] [address], [address] [place], [place] [address] [phone]

## 2023-09-29 ENCOUNTER — Ambulatory Visit (HOSPITAL_BASED_OUTPATIENT_CLINIC_OR_DEPARTMENT_OTHER): Payer: Medicare HMO | Admitting: Family

## 2023-09-29 ENCOUNTER — Encounter (HOSPITAL_BASED_OUTPATIENT_CLINIC_OR_DEPARTMENT_OTHER): Payer: Self-pay | Admitting: Family

## 2023-09-29 VITALS — BP 120/86 | HR 61 | Ht 60.0 in | Wt 226.6 lb

## 2023-09-29 DIAGNOSIS — I422 Other hypertrophic cardiomyopathy: Secondary | ICD-10-CM | POA: Diagnosis not present

## 2023-09-29 DIAGNOSIS — G4733 Obstructive sleep apnea (adult) (pediatric): Secondary | ICD-10-CM | POA: Diagnosis not present

## 2023-09-29 DIAGNOSIS — I10 Essential (primary) hypertension: Secondary | ICD-10-CM | POA: Diagnosis not present

## 2023-09-29 MED ORDER — METOPROLOL SUCCINATE ER 50 MG PO TB24: 50.0000 mg | ORAL_TABLET | Freq: Every day | ORAL | 3 refills | Status: DC

## 2023-09-29 NOTE — Progress Notes (Unsigned)
Cardiology Office Note:  .   Date:  10/01/2023  ID:  Savon Cobbs, DOB [birth year], MRN [id number] PCP: Raymon Mutton., FNP  [facility] Providers Cardiologist:  Jodelle Red, MD    History of Present Illness: .   Yolanda Lang is a 77 y.o. female with history of apical variant hypertrophic cardiomyopathy, hypertension, OSA on BiPAP  Prior echo 04/2021 normal LVEF 65 to 70%, severe LVH, grade 2 diastolic dysfunction, moderately elevated PASP, mild dilation ascending aorta 38 mm.  MRI stress test 06/2021 asymmetric LVH consistent with apical hypertrophic carciomyopathy, apical stress perfusion defect consistent with ischemia however stress perfusion defects also seen in apical HCM in absence of obstructive CAD. She was feeling well and preferred to defer ischemic eval.  Last in 619/24 by Dr. Cristal Deer noting fatigue, central nonradiating chest discomfort, weakness in her legs.  Symptoms were overall improved after getting help 3 times per week as her husband had required increased care.  Previous echo and stress cardiac MRI supported apical hypertrophic cardiomyopathy.  Anatomic scan such as CT or cath discussed but she wished to hold on further testing.  Saw PCP on 09/21/23 and was started on PRN Hydroxyzine for anxiety and sleep. Has not yet picked up.   Presents today for follow up independently. Her husband has dementia which is a lot of caretaking. She is wearing BIPAP regularly. Not monitoring BP at home. Ongoing sinus issus with occasional dizziness particularly with position changes. No near syncope nor syncope. Otherwise doing well from cardiac perspective with no chest pain, dyspnea, edema, orthopnea, PND. Energy level improved since last office visit.  ROS: Please see the history of present illness.    All other systems reviewed and are negative.   Studies Reviewed: .        Cardiac Studies & Procedures       ECHOCARDIOGRAM  ECHOCARDIOGRAM COMPLETE  04/23/2021  Narrative ECHOCARDIOGRAM REPORT    Patient Name:   Yolanda Lang   Date of Exam: 04/23/2021 Medical Rec #:  [id number]     Height:       59.0 in Accession #:    [id number]    Weight:       227.6 lb Date of Birth:  1946/10/12      BSA:          1.949 m Patient Age:    75 years      BP:           140/88 mmHg Patient Gender: F             HR:           62 bpm. Exam Location:  [address]  Procedure: 2D Echo, 3D Echo, Cardiac Doppler and Color Doppler  Indications:    R06.00 Dyspnea  History:        Patient has no prior history of Echocardiogram examinations. CHF, Mitral Valve Prolapse; Risk Factors:Family History of Coronary Artery Disease, Hypertension, Former Smoker and Sleep Apnea. Obesity, Edema, Palpitations.  Sonographer:    Farrel Conners RDCS Referring Phys: [id number] BRIDGETTE CHRISTOPHER  IMPRESSIONS   1. Left ventricular ejection fraction, by estimation, is 65 to 70%. The left ventricle has normal function. The left ventricle has no regional wall motion abnormalities. There is severe left ventricular hypertrophy. Left ventricular diastolic parameters are consistent with Grade II diastolic dysfunction (pseudonormalization). Elevated left atrial pressure. 2. Right ventricular systolic function is normal. The right ventricular size is normal. There is moderately elevated pulmonary artery systolic  pressure. The estimated right ventricular systolic pressure is 50.1 mmHg. 3. The mitral valve is normal in structure. Trivial mitral valve regurgitation. 4. The aortic valve was not well visualized. Aortic valve regurgitation is not visualized. No aortic stenosis is present. 5. Aortic dilatation noted. There is mild dilatation of the ascending aorta, measuring 38 mm. 6. Left atrial size was mildly dilated. 7. The inferior vena cava is normal in size with greater than 50% respiratory variability, suggesting right atrial pressure of 3 mmHg. 8. Severe LVH, most pronounced at  the apex with cavity obliteration in systole. 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 Recommend cardiac MRI for further evaluation  FINDINGS Left Ventricle: Left ventricular ejection fraction, by estimation, is 65 to 70%. The left ventricle has normal function. The left ventricle has no regional wall motion abnormalities. 3D left ventricular ejection fraction analysis performed but not reported based on interpreter judgement due to suboptimal quality. The left ventricular internal cavity size was normal in size. There is severe left ventricular hypertrophy. Left ventricular diastolic parameters are consistent with Grade II diastolic dysfunction (pseudonormalization). Elevated left atrial pressure.  Right Ventricle: The right ventricular size is normal. No increase in right ventricular wall thickness. Right ventricular systolic function is normal. There is moderately elevated pulmonary artery systolic pressure. The tricuspid regurgitant velocity is 3.43 m/s, and with an assumed right atrial pressure of 3 mmHg, the estimated right ventricular systolic pressure is 50.1 mmHg.  Left Atrium: Left atrial size was mildly dilated.  Right Atrium: Right atrial size was normal in size.  Pericardium: There is no evidence of pericardial effusion.  Mitral Valve: The mitral valve is normal in structure. Trivial mitral valve regurgitation.  Tricuspid Valve: The tricuspid valve is normal in structure. Tricuspid valve regurgitation is mild.  Aortic Valve: The aortic valve was not well visualized. Aortic valve regurgitation is not visualized. No aortic stenosis is present.  Pulmonic Valve: The pulmonic valve was not well visualized. Pulmonic valve regurgitation is not visualized.  Aorta: The aortic root is normal in size and structure and aortic dilatation noted. There is mild dilatation of the ascending aorta, measuring 38 mm.  Venous: The inferior vena cava is normal in size with greater than 50%  respiratory variability, suggesting right atrial pressure of 3 mmHg.  IAS/Shunts: The interatrial septum was not well visualized.   LEFT VENTRICLE PLAX 2D LVIDd:         4.35 cm  Diastology LVIDs:         2.25 cm  LV e' medial:    7.29 cm/s LV PW:         1.25 cm  LV E/e' medial:  11.7 LV IVS:        1.05 cm  LV e' lateral:   6.53 cm/s LVOT diam:     2.10 cm  LV E/e' lateral: 13.1 LV SV:         89 LV SV Index:   46 LVOT Area:     3.46 cm  3D Volume EF: 3D EF:        78 % LV EDV:       155 ml LV ESV:       34 ml LV SV:        121 ml  RIGHT VENTRICLE TAPSE (M-mode): 1.8 cm  LEFT ATRIUM             Index       RIGHT ATRIUM           Index  LA diam:        3.35 cm 1.72 cm/m  RA Area:     12.90 cm LA Vol (A2C):   67.2 ml 34.49 ml/m RA Volume:   28.10 ml  14.42 ml/m LA Vol (A4C):   71.4 ml 36.64 ml/m LA Biplane Vol: 74.3 ml 38.13 ml/m AORTIC VALVE LVOT Vmax:   99.00 cm/s LVOT Vmean:  70.900 cm/s LVOT VTI:    0.257 m  AORTA Ao Root diam: 3.20 cm Ao Asc diam:  3.65 cm  MITRAL VALVE               TRICUSPID VALVE MV Area (PHT): cm         TR Peak grad:   47.1 mmHg MV Decel Time: 236 msec    TR Vmax:        343.00 cm/s MV E velocity: 85.45 cm/s MV A velocity: 80.30 cm/s  SHUNTS MV E/A ratio:  1.06        Systemic VTI:  0.26 m Systemic Diam: 2.10 cm  Epifanio Lesches MD Electronically signed by Epifanio Lesches MD Signature Date/Time: 04/23/2021/10:25:23 PM    Final             Risk Assessment/Calculations:            Physical Exam:   VS:  BP 120/86 (BP Location: Right Arm, Patient Position: Sitting, Cuff Size: Large)   Pulse 61   Ht 5' (1.524 m)   Wt 226 lb 9.6 oz (102.8 kg)   SpO2 94%   BMI 44.25 kg/m    Wt Readings from Last 3 Encounters:  09/29/23 226 lb 9.6 oz (102.8 kg)  06/03/23 223 lb 3.2 oz (101.2 kg)  03/23/23 225 lb 8 oz (102.3 kg)    GEN: Well nourished, well developed in no acute distress NECK: No JVD; No carotid  bruits CARDIAC: RRR, no murmurs, rubs, gallops RESPIRATORY:  Clear to auscultation without rales, wheezing or rhonchi  ABDOMEN: Soft, non-tender, non-distended EXTREMITIES:  No edema; No deformity   ASSESSMENT AND PLAN: .     Dyspnea on exertion/apical hypertrophic cardiomyopathy- dyspnea on exertion, fatigue improved since last office visit as has more help caretaking for her husband with dementia. Given improvement in symptoms, defer ischemic evaluation. Continue optimal BP control including Amlodipine, Lasix, Toprol, Valsartan.   Hypertension- BP well controlled. Continue current antihypertensive regimen.    OSA- CPAP compliance encouraged. Wearing CPAP regularly.        Dispo: follow up in 6 mos  Signed, Alver Sorrow, NP

## 2023-09-29 NOTE — Patient Instructions (Addendum)
Medication Instructions:  Continue your current medications.  *If you need a refill on your cardiac medications before your next appointment, please call your pharmacy*  Follow-Up: At [facility], you and your health needs are our priority.  As part of our continuing mission to provide you with exceptional heart care, we have created designated Provider Care Teams.  These Care Teams include your primary Cardiologist (physician) and Advanced Practice Providers (APPs -  Physician Assistants and Nurse Practitioners) who all work together to provide you with the care you need, when you need it.  We recommend signing up for the patient portal called "MyChart".  Sign up information is provided on this After Visit Summary.  MyChart is used to connect with patients for Virtual Visits (Telemedicine).  Patients are able to view lab/test results, encounter notes, upcoming appointments, etc.  Non-urgent messages can be sent to your provider as well.   To learn more about what you can do with MyChart, go to [email or web address].    Your next appointment:   6 month(s)  Provider:   Jodelle Red, MD    Other Instructions  [facility] at [facility] [address] [place],  [place]  [address] Main: [phone] Simply put 'adherence packaging' and 'for mail delivery' on the prescription and they will put it in a pill pack for you and mail it to your home for free.   Pursed Lip Breathing Pursed lip breathing is a technique to relieve the feeling of being short of breath.  Being short of breath can make you tense and anxious. Before you start this breathing exercise, take a minute to relax your shoulders and close your eyes. Then: Start the exercise by closing your mouth. Breathe in through your nose, taking a normal breath. You can do this at your normal rate of breathing. If you feel you are not getting enough air, breathe in while slowly counting to 2 or  3. Pucker (purse) your lips as if you were going to whistle. Gently tighten the muscles of your abdomenor press on your abdomen to help push the air out. Breathe out slowly through your pursed lips. Take at least twice as long to breathe out as it takes you to breathe in. Make sure that you breathe out all of the air, but do not force air out.

## 2024-01-29 ENCOUNTER — Ambulatory Visit: Payer: Medicare (Managed Care) | Admitting: Nurse Practitioner

## 2024-04-11 ENCOUNTER — Ambulatory Visit (HOSPITAL_BASED_OUTPATIENT_CLINIC_OR_DEPARTMENT_OTHER): Admitting: Cardiology

## 2024-06-01 ENCOUNTER — Encounter: Payer: Self-pay | Admitting: Gastroenterology

## 2024-06-01 ENCOUNTER — Other Ambulatory Visit

## 2024-06-01 ENCOUNTER — Ambulatory Visit: Payer: Medicare (Managed Care) | Admitting: Gastroenterology

## 2024-06-01 VITALS — BP 120/72 | HR 58 | Ht 60.0 in | Wt 232.0 lb

## 2024-06-01 DIAGNOSIS — R1031 Right lower quadrant pain: Secondary | ICD-10-CM

## 2024-06-01 DIAGNOSIS — K580 Irritable bowel syndrome with diarrhea: Secondary | ICD-10-CM

## 2024-06-01 DIAGNOSIS — R198 Other specified symptoms and signs involving the digestive system and abdomen: Secondary | ICD-10-CM

## 2024-06-01 DIAGNOSIS — R140 Abdominal distension (gaseous): Secondary | ICD-10-CM

## 2024-06-01 MED ORDER — METRONIDAZOLE 500 MG PO TABS
500.0000 mg | ORAL_TABLET | Freq: Two times a day (BID) | ORAL | 0 refills | Status: AC
Start: 2024-06-01 — End: 2024-06-11

## 2024-06-01 MED ORDER — DICYCLOMINE HCL 10 MG PO CAPS
10.0000 mg | ORAL_CAPSULE | Freq: Two times a day (BID) | ORAL | 2 refills | Status: DC
Start: 2024-06-01 — End: 2024-08-29

## 2024-06-01 NOTE — Progress Notes (Signed)
 [facility] Consult Note:  History: Yolanda Lang 06/01/2024  Referring provider: Shannan Dart., FNP  Reason for consult/chief complaint: IBS/Diarrhea (Patient states that smelling foods make her nauseous. She has to have go to the bathroom right after.) and Abdominal Pain (Patient stated that she has RLQ pain comes and goes and has been going on for 6 months.)   Subjective  Prior history: November 2020 hospitalization for lower GI bleeding (probably diverticular) EGD and colonoscopy that admission  -colon biopsies negative for microscopic colitis, subcentimeter tubular adenoma  Last seen in clinic by Dr. Dominic Friendly 05/24/2021 for longstanding IBS-D with bloating urgency and right lower quadrant pain.  No prior SIBO testing or treatment No prior celiac testing in this EHR  Discussed the use of AI scribe software for clinical note transcription with the patient, who gave verbal consent to proceed.  History of Present Illness Yolanda Lang is an 78 year old female who presents with ongoing bowel issues and diarrhea. She is accompanied by her husband.  She has been experiencing fluctuating bowel symptoms, recently worsening with sudden right-sided abdominal pain followed by an urgent need to defecate. This urgency sometimes occurs after eating or even smelling food, causing nausea. She recalls having been told many years ago she may have a gastric ulcer and thought the symptoms might be reminiscent of that.  [name] experiences fluid retention, particularly in her legs, for which she takes Lasix daily. She has chronic sinus problems and uses two inhalers.  She also perceives that post-nasal drip from her sinus issues exacerbates her gastrointestinal symptoms, causing her to run to the bathroom.  She takes Lomotil  for her diarrhea, using it two to three times a week recently due to increased symptoms. Despite this, she sometimes experiences sudden bowel urgency and has had  accidents.   She believes her weight has fluctuated that may at least in part be due to use of diuretics.  Denies vomiting dysphagia or rectal bleeding   ROS:  Review of Systems  Constitutional:  Negative for appetite change and unexpected weight change.  HENT:  Negative for mouth sores and voice change.   Eyes:  Negative for pain and redness.  Respiratory:  Negative for cough and shortness of breath.   Cardiovascular:  Negative for chest pain and palpitations.  Genitourinary:  Negative for dysuria and hematuria.  Musculoskeletal:  Positive for arthralgias. Negative for myalgias.  Skin:  Negative for pallor and rash.  Neurological:  Negative for weakness and headaches.  Hematological:  Negative for adenopathy.     Past Medical History: Past Medical History:  Diagnosis Date   Arthritis    Cataracts, bilateral    CHF (congestive heart failure) (HCC)    Decreased vision    Diverticulosis    Forgetfulness    GERD (gastroesophageal reflux disease)    GI bleed    Hypertension    IBS (irritable bowel syndrome)    MVP (mitral valve prolapse)    OSA (obstructive sleep apnea)    Prediabetes    Psoriasis    Sleep apnea    BIPAP     Past Surgical History: Past Surgical History:  Procedure Laterality Date   APPENDECTOMY     BIOPSY  11/01/2019   Procedure: BIOPSY;  Surgeon: Albertina Hugger, MD;  Location: [facility] ENDOSCOPY;  Service: Gastroenterology;;   COLONOSCOPY WITH PROPOFOL  N/A 11/01/2019   Procedure: COLONOSCOPY WITH PROPOFOL ;  Surgeon: Albertina Hugger, MD;  Location: [facility] ENDOSCOPY;  Service: Gastroenterology;  Laterality: N/A;   ESOPHAGOGASTRODUODENOSCOPY (EGD) WITH PROPOFOL  N/A 11/01/2019   Procedure: ESOPHAGOGASTRODUODENOSCOPY (EGD) WITH PROPOFOL ;  Surgeon: Albertina Hugger, MD;  Location: [facility] ENDOSCOPY;  Service: Gastroenterology;  Laterality: N/A;   FOOT SURGERY Right    right foot joint removal    PARTIAL HYSTERECTOMY  2008   partial   POLYPECTOMY  11/01/2019    Procedure: POLYPECTOMY;  Surgeon: Albertina Hugger, MD;  Location: [facility] ENDOSCOPY;  Service: Gastroenterology;;     Family History: Family History  Problem Relation Age of Onset   Stroke Father    Heart disease Father    Heart attack Father    Hypertension Father    COPD Mother    Hypertension Mother    Diabetes Sister    Diabetes Maternal Grandmother    Diabetes Sister    Pancreatic disease Brother    Hypertension Daughter    Hypertension Daughter     Social History: Social History   Socioeconomic History   Marital status: Married    Spouse name: Not on file   Number of children: 5   Years of education: Not on file   Highest education level: Not on file  Occupational History   Occupation: retired  Tobacco Use   Smoking status: Former    Current packs/day: 0.00    Types: Cigarettes    Quit date: 05/08/1985    Years since quitting: 39.0   Smokeless tobacco: Never   Tobacco comments:    quit in 1975  Vaping Use   Vaping status: Never Used  Substance and Sexual Activity   Alcohol use: Never   Drug use: Never   Sexual activity: Not on file  Other Topics Concern   Not on file  Social History Narrative   Not on file   Social Drivers of Health   Financial Resource Strain: Not on file  Food Insecurity: Not on file  Transportation Needs: Not on file  Physical Activity: Not on file  Stress: Not on file  Social Connections: Unknown (04/29/2022)   Received from [facility]   Social Network    Social Network: Not on file    Allergies: Allergies  Allergen Reactions   Aspirin Other (See Comments)    Hx of GI bled   Other     Celdane    Outpatient Meds: Current Outpatient Medications  Medication Sig Dispense Refill   albuterol  (VENTOLIN  HFA) 108 (90 Base) MCG/ACT inhaler SMARTSIG:1-2 Puff(s) Via Inhaler Every 6 Hours PRN     amLODipine (NORVASC) 5 MG tablet Take 5 mg by mouth daily.     azelastine (ASTELIN) 0.1 % nasal spray Place 1 spray into both  nostrils 2 (two) times daily.     budesonide-formoterol (SYMBICORT) 80-4.5 MCG/ACT inhaler SMARTSIG:2 Puff(s) By Mouth Morning-Evening     calcipotriene (DOVONOX) 0.005 % cream Apply 1 application topically in the morning and at bedtime.     cetirizine (ZYRTEC) 10 MG tablet Take 10 mg by mouth daily.     clotrimazole-betamethasone (LOTRISONE) cream Apply 1 application topically 2 (two) times daily as needed (for rash).     diclofenac Sodium (VOLTAREN) 1 % GEL Apply 2 g topically 4 (four) times daily.     diphenoxylate -atropine  (LOMOTIL ) 2.5-0.025 MG tablet TAKE 1 TO 2 TABLETS BY MOUTH TWICE DAILY AS NEEDED FOR DIARRHEA OR LOOSE STOOLS 30 tablet 0   donepezil  (ARICEPT ) 23 MG TABS tablet Take 23 mg by mouth at bedtime.     furosemide (LASIX) 40 MG tablet  Take 40 mg by mouth daily.     metoprolol  succinate (TOPROL -XL) 50 MG 24 hr tablet Take 1 tablet (50 mg total) by mouth daily. 90 tablet 3   montelukast  (SINGULAIR ) 10 MG tablet Take 10 mg by mouth daily.     oxymetazoline (AFRIN) 0.05 % nasal spray Place 2 sprays into both nostrils 2 (two) times daily.     pantoprazole  (PROTONIX ) 40 MG tablet Take 40 mg by mouth 2 (two) times daily.     traMADol (ULTRAM) 50 MG tablet Take 50 mg by mouth every 8 (eight) hours as needed for moderate pain or severe pain.     valsartan (DIOVAN) 320 MG tablet Take 320 mg by mouth daily.     No current facility-administered medications for this visit.      ___________________________________________________________________ Objective   Exam:  BP 120/72   Pulse (!) 58   Ht 5' (1.524 m)   Wt 232 lb (105.2 kg)   BMI 45.31 kg/m  Wt Readings from Last 3 Encounters:  06/01/24 232 lb (105.2 kg)  09/29/23 226 lb 9.6 oz (102.8 kg)  06/03/23 223 lb 3.2 oz (101.2 kg)    General: Well-appearing Eyes: sclera anicteric, no redness ENT: oral mucosa moist without lesions, no cervical or supraclavicular lymphadenopathy CV: Regular without appreciable murmur, no JVD,  mild bilateral lower extremity edema Resp: clear to auscultation bilaterally, normal RR and effort noted GI: soft, obese, no tenderness, with active bowel sounds. No guarding or palpable organomegaly noted. Skin; warm and dry, no rash or jaundice noted Neuro: awake, alert and oriented x 3. Normal gross motor function and fluent speech   Data:  Last EKG 06/03/2023 -NSR, normal QTc Assessment & Plan Diarrhea-type Irritable Bowel Syndrome Chronic diarrhea with urgency and abdominal pain, consistent with diarrhea-type IBS. Differential also includes celiac disease and SIBO.  Does not sound like an upper digestive condition.  Discussed available SIBO breath testing but also with limitations.  Discussed empiric use of metronidazole for SIBO and its side effects. Considered alternative antidiarrheal to Lomotil  with fewer side effects. - Order celiac antibody testing. - Prescribe metronidazole for suspected SIBO. - Discontinue regular use of Lomotil ; reserve for emergency use. - Initiate trial of dicyclomine 10 mg tablet/capsule.  1 capsule twice daily before breakfast and supper.  Though this can have anticholinergic side effects and perhaps more so in elderly patients, she has tolerated Lomotil  that has the more potent anticholinergic component atropine .  Therefore I think the dicyclomine would likely be tolerated.  She was instructed to call us  if she has any apparent side effects including but not limited to dizziness/lightheadedness, visual disturbance fatigue or constipation.  Follow-up with APP arranged  No current plans for endoscopic testing   Thank you for the courtesy of this consult.  Please call me with any questions or concerns.  Kerby Pearson III  CC: Referring provider noted above

## 2024-06-01 NOTE — Patient Instructions (Signed)
 Your provider has requested that you go to the basement level for lab work before leaving today. Press B on the elevator. The lab is located at the first door on the left as you exit the elevator.   We have sent the following medications to your pharmacy for you to pick up at your convenience: Metronidazole and Dicyclomine   _______________________________________________________  If your blood pressure at your visit was 140/90 or greater, please contact your primary care physician to follow up on this.  _______________________________________________________  If you are age 39 or older, your body mass index should be between 23-30. Your Body mass index is 45.31 kg/m. If this is out of the aforementioned range listed, please consider follow up with your Primary Care Provider.  If you are age 77 or younger, your body mass index should be between 19-25. Your Body mass index is 45.31 kg/m. If this is out of the aformentioned range listed, please consider follow up with your Primary Care Provider.   ________________________________________________________  The [facility] providers would like to encourage you to use MYCHART to communicate with providers for non-urgent requests or questions.  Due to long hold times on the telephone, sending your provider a message by [facility] may be a faster and more efficient way to get a response.  Please allow 48 business hours for a response.  Please remember that this is for non-urgent requests.  _______________________________________________________  Thank you for trusting me with your gastrointestinal care!    Dr. [facility]ale[facility]

## 2024-06-02 LAB — TISSUE TRANSGLUTAMINASE ABS,IGG,IGA
(tTG) Ab, IgA: <1 U/mL
(tTG) Ab, IgG: <1 U/mL

## 2024-06-02 LAB — IGA: Immunoglobulin A: 87 mg/dL (ref 70–320)

## 2024-06-03 ENCOUNTER — Ambulatory Visit: Payer: Self-pay | Admitting: Gastroenterology

## 2024-06-10 ENCOUNTER — Other Ambulatory Visit (HOSPITAL_BASED_OUTPATIENT_CLINIC_OR_DEPARTMENT_OTHER): Payer: Self-pay | Admitting: Family

## 2024-06-10 DIAGNOSIS — I422 Other hypertrophic cardiomyopathy: Secondary | ICD-10-CM

## 2024-06-30 ENCOUNTER — Other Ambulatory Visit (HOSPITAL_COMMUNITY): Payer: Self-pay

## 2024-06-30 ENCOUNTER — Telehealth: Payer: Self-pay

## 2024-06-30 NOTE — Telephone Encounter (Signed)
 Pharmacy Patient Advocate Encounter  Received notification from [employer] [employer] that Prior Authorization for  Dicyclomine  HCl 10MG  capsules has been APPROVED from 03-15-2024 to 12-14-2024   PA #/Case ID/Reference #: [id number]

## 2024-06-30 NOTE — Telephone Encounter (Signed)
 Pharmacy Patient Advocate Encounter   Received notification from CoverMyMeds that prior authorization for Dicyclomine  HCl 10MG  capsules is required/requested.   Insurance verification completed.   The patient is insured through [employer] [facility] [employer] .   Per test claim: PA required; PA submitted to above mentioned insurance via CoverMyMeds Key/confirmation #/EOC [id number] Status is pending

## 2024-06-30 NOTE — Telephone Encounter (Signed)
 Noted pt. advised

## 2024-07-15 ENCOUNTER — Ambulatory Visit: Admitting: Gastroenterology

## 2024-07-29 ENCOUNTER — Ambulatory Visit (HOSPITAL_BASED_OUTPATIENT_CLINIC_OR_DEPARTMENT_OTHER): Admitting: Cardiology

## 2024-08-04 ENCOUNTER — Ambulatory Visit (HOSPITAL_BASED_OUTPATIENT_CLINIC_OR_DEPARTMENT_OTHER): Admitting: Cardiology

## 2024-08-25 NOTE — Progress Notes (Deleted)
  Cardiology Office Note:  .   Date:  08/25/2024  ID:  Jenkins Mirza, DOB 08-19-1946, MRN [id number] PCP: Claudene Prentice DELENA Mickey., FNP  [facility] Providers Cardiologist:  Shelda Bruckner, MD    History of Present Illness: .   Yolanda Lang is a 78 y.o. female with history of apical variant hypertrophic cardiomyopathy, hypertension, OSA on BiPAP  Prior echo 04/2021 normal LVEF 65 to 70%, severe LVH, grade 2 diastolic dysfunction, moderately elevated PASP, mild dilation ascending aorta 38 mm.  MRI stress test 06/2021 asymmetric LVH consistent with apical hypertrophic carciomyopathy, apical stress perfusion defect consistent with ischemia however stress perfusion defects also seen in apical HCM in absence of obstructive CAD. She was feeling well and preferred to defer ischemic eval.  Seen 05/2023 by Dr. Bruckner noting fatigue, central nonradiating chest discomfort, weakness in her legs.  Symptoms were overall improved after getting help 3 times per week as her husband had required increased care.  Previous echo and stress cardiac MRI supported apical hypertrophic cardiomyopathy.  Anatomic scan such as CT or cath discussed but she wished to hold on further testing.  At visit 09/29/23 her dyspnea was much improved after having additional help caregiving for her husband. Given improvement in symptoms, ischemic evaluation deferred.    Presents today for follow up independently. Her husband has dementia which is a lot of caretaking. ***  ROS: Please see the history of present illness.    All other systems reviewed and are negative.   Studies Reviewed: .        ***   Risk Assessment/Calculations:            Physical Exam:   VS:  There were no vitals taken for this visit.   Wt Readings from Last 3 Encounters:  06/01/24 232 lb (105.2 kg)  09/29/23 226 lb 9.6 oz (102.8 kg)  06/03/23 223 lb 3.2 oz (101.2 kg)    GEN: Well nourished, well developed in no acute distress NECK: No JVD; No  carotid bruits CARDIAC: RRR, no murmurs, rubs, gallops RESPIRATORY:  Clear to auscultation without rales, wheezing or rhonchi  ABDOMEN: Soft, non-tender, non-distended EXTREMITIES:  No edema; No deformity   ASSESSMENT AND PLAN: .     Dyspnea on exertion/apical hypertrophic cardiomyopathy- ***  Hypertension- ***  OSA- ***       Dispo: follow up in ***  Signed, Reche GORMAN Finder, NP

## 2024-08-26 ENCOUNTER — Ambulatory Visit (HOSPITAL_BASED_OUTPATIENT_CLINIC_OR_DEPARTMENT_OTHER): Admitting: Family

## 2024-08-27 ENCOUNTER — Other Ambulatory Visit: Payer: Self-pay | Admitting: Gastroenterology

## 2024-08-27 DIAGNOSIS — R198 Other specified symptoms and signs involving the digestive system and abdomen: Secondary | ICD-10-CM

## 2024-08-27 DIAGNOSIS — R140 Abdominal distension (gaseous): Secondary | ICD-10-CM

## 2024-08-27 DIAGNOSIS — K580 Irritable bowel syndrome with diarrhea: Secondary | ICD-10-CM

## 2024-08-27 DIAGNOSIS — R1031 Right lower quadrant pain: Secondary | ICD-10-CM

## 2024-08-29 ENCOUNTER — Encounter: Payer: Self-pay | Admitting: Gastroenterology

## 2024-08-29 ENCOUNTER — Ambulatory Visit: Admitting: Gastroenterology

## 2024-08-29 VITALS — BP 130/78 | HR 54 | Ht 60.0 in | Wt 233.0 lb

## 2024-08-29 DIAGNOSIS — K580 Irritable bowel syndrome with diarrhea: Secondary | ICD-10-CM | POA: Insufficient documentation

## 2024-08-29 DIAGNOSIS — R110 Nausea: Secondary | ICD-10-CM

## 2024-08-29 MED ORDER — CHOLESTYRAMINE 4 G PO PACK: 4.0000 g | PACK | Freq: Every day | ORAL | 2 refills | Status: DC

## 2024-08-29 MED ORDER — PANTOPRAZOLE SODIUM 40 MG PO TBEC: 40.0000 mg | DELAYED_RELEASE_TABLET | Freq: Two times a day (BID) | ORAL | 5 refills | Status: AC

## 2024-08-29 NOTE — Progress Notes (Signed)
 ____________________________________________________________  Attending physician addendum:  Thank you for sending this case to me. I have reviewed the entire note and agree with the plan.  Lack of lasting response to the metronidazole  makes SIBO less likely, and rifaximin would be prohibitively expensive for empiric use. Would avoid Viberzi in this age group.  Victory Brand, MD  ____________________________________________________________

## 2024-08-29 NOTE — Patient Instructions (Signed)
 We have sent the following medications to your pharmacy for you to pick up at your convenience: Pantoprazole  40 mg twice daily 30-60 minutes before breakfast and dinner. Questran  1 packet daily at lunchtime.   _______________________________________________________  If your blood pressure at your visit was 140/90 or greater, please contact your primary care physician to follow up on this.  _______________________________________________________  If you are age 80 or older, your body mass index should be between 23-30. Your Body mass index is 45.5 kg/m. If this is out of the aforementioned range listed, please consider follow up with your Primary Care Provider.  If you are age 80 or younger, your body mass index should be between 19-25. Your Body mass index is 45.5 kg/m. If this is out of the aformentioned range listed, please consider follow up with your Primary Care Provider.   ________________________________________________________  The [facility] providers would like to encourage you to use MYCHART to communicate with providers for non-urgent requests or questions.  Due to long hold times on the telephone, sending your provider a message by [facility] may be a faster and more efficient way to get a response.  Please allow 48 business hours for a response.  Please remember that this is for non-urgent requests.  _______________________________________________________  [facility] is using a team-based approach to care.  Your team is made up of your doctor and two to three APPS. Our APPS (Nurse Practitioners and Physician Assistants) work with your physician to ensure care continuity for you. They are fully qualified to address your health concerns and develop a treatment plan. They communicate directly with your gastroenterologist to care for you. Seeing the Advanced Practice Practitioners on your physician's team can help you by facilitating care more promptly, often allowing for  earlier appointments, access to diagnostic testing, procedures, and other specialty referrals.

## 2024-08-29 NOTE — Progress Notes (Signed)
 08/29/2024 Yolanda Lang [id number] 09/09/1946   HISTORY OF PRESENT ILLNESS: This is an 78 year old female who is a patient of Dr. Clayburn.  She was last seen here by Dr. Legrand in June 2025 and is here for follow-up of her IBS type symptoms.  Reports longstanding history of IBS with diarrhea.  Celiac labs negative.  Reports using Imodium for years in the past, but lost efficacy.  Recently was started on dicyclomine  10 mg twice daily.  Feels like that helps with the abdominal pain to a degree.  Was treated with metronidazole  empirically for SIBO, but felt like it really only helped her symptoms while she was on the medication, symptoms returned after completing it.  Prior history: November 2020 hospitalization for lower GI bleeding (probably diverticular) EGD and colonoscopy that admission  -colon biopsies negative for microscopic colitis, subcentimeter tubular adenoma  She also describes issues recently with feeling nauseated with certain smells of foods.  She says that this has been going on for a couple weeks, but when I looked back at Dr. Clayburn note it was mentioned at that time.  She has been off of her PPI for the last week or so as she has had trouble getting it from her PCPs office.  She does report issues with chronic sinusitis.  Reports feeling bloated due to fluid retention.    Past Medical History:  Diagnosis Date   Arthritis    Cataracts, bilateral    CHF (congestive heart failure) (HCC)    Decreased vision    Diverticulosis    Forgetfulness    GERD (gastroesophageal reflux disease)    GI bleed    Hypertension    IBS (irritable bowel syndrome)    MVP (mitral valve prolapse)    OSA (obstructive sleep apnea)    Prediabetes    Psoriasis    Sleep apnea    BIPAP   Past Surgical History:  Procedure Laterality Date   APPENDECTOMY     BIOPSY  11/01/2019   Procedure: BIOPSY;  Surgeon: Legrand Victory LITTIE DOUGLAS, MD;  Location: [facility] ENDOSCOPY;  Service: Gastroenterology;;    COLONOSCOPY WITH PROPOFOL  N/A 11/01/2019   Procedure: COLONOSCOPY WITH PROPOFOL ;  Surgeon: Legrand Victory LITTIE DOUGLAS, MD;  Location: [facility] ENDOSCOPY;  Service: Gastroenterology;  Laterality: N/A;   ESOPHAGOGASTRODUODENOSCOPY (EGD) WITH PROPOFOL  N/A 11/01/2019   Procedure: ESOPHAGOGASTRODUODENOSCOPY (EGD) WITH PROPOFOL ;  Surgeon: Legrand Victory LITTIE DOUGLAS, MD;  Location: [facility] ENDOSCOPY;  Service: Gastroenterology;  Laterality: N/A;   FOOT SURGERY Right    right foot joint removal    PARTIAL HYSTERECTOMY  2008   partial   POLYPECTOMY  11/01/2019   Procedure: POLYPECTOMY;  Surgeon: Legrand Victory LITTIE DOUGLAS, MD;  Location: [facility] ENDOSCOPY;  Service: Gastroenterology;;    reports that she quit smoking about 39 years ago. Her smoking use included cigarettes. She has never used smokeless tobacco. She reports that she does not drink alcohol and does not use drugs. family history includes COPD in her mother; Diabetes in her maternal grandmother, sister, and sister; Heart attack in her father; Heart disease in her father; Hypertension in her daughter, daughter, father, and mother; Pancreatic disease in her brother; Stroke in her father. Allergies  Allergen Reactions   Aspirin Other (See Comments)    Hx of GI bled   Other     Celdane      Outpatient Encounter Medications as of 08/29/2024  Medication Sig   albuterol  (VENTOLIN  HFA) 108 (90 Base) MCG/ACT inhaler SMARTSIG:1-2 Puff(s) Via Inhaler  Every 6 Hours PRN   amLODipine (NORVASC) 5 MG tablet Take 5 mg by mouth daily.   azelastine (ASTELIN) 0.1 % nasal spray Place 1 spray into both nostrils 2 (two) times daily.   budesonide-formoterol (SYMBICORT) 80-4.5 MCG/ACT inhaler SMARTSIG:2 Puff(s) By Mouth Morning-Evening   calcipotriene (DOVONOX) 0.005 % cream Apply 1 application topically in the morning and at bedtime.   cetirizine (ZYRTEC) 10 MG tablet Take 10 mg by mouth daily.   clotrimazole-betamethasone (LOTRISONE) cream Apply 1 application topically 2 (two) times daily as  needed (for rash).   diclofenac Sodium (VOLTAREN) 1 % GEL Apply 2 g topically 4 (four) times daily.   dicyclomine  (BENTYL ) 10 MG capsule Take 1 capsule (10 mg total) by mouth 2 (two) times daily before a meal.   diphenoxylate -atropine  (LOMOTIL ) 2.5-0.025 MG tablet TAKE 1 TO 2 TABLETS BY MOUTH TWICE DAILY AS NEEDED FOR DIARRHEA OR LOOSE STOOLS   donepezil  (ARICEPT ) 23 MG TABS tablet Take 23 mg by mouth at bedtime.   furosemide (LASIX) 40 MG tablet Take 40 mg by mouth daily.   hydrOXYzine (ATARAX) 50 MG tablet Take 50 mg by mouth at bedtime.   metoprolol  succinate (TOPROL -XL) 50 MG 24 hr tablet TAKE 1 TABLET BY MOUTH EVERY DAY   montelukast  (SINGULAIR ) 10 MG tablet Take 10 mg by mouth daily.   oxymetazoline (AFRIN) 0.05 % nasal spray Place 2 sprays into both nostrils 2 (two) times daily.   pantoprazole  (PROTONIX ) 40 MG tablet Take 40 mg by mouth 2 (two) times daily.   traMADol (ULTRAM) 50 MG tablet Take 50 mg by mouth every 8 (eight) hours as needed for moderate pain or severe pain.   triamcinolone cream (KENALOG) 0.1 % Apply 1 Application topically 2 (two) times daily.   valsartan (DIOVAN) 320 MG tablet Take 320 mg by mouth daily.   No facility-administered encounter medications on file as of 08/29/2024.    REVIEW OF SYSTEMS  : All other systems reviewed and negative except where noted in the History of Present Illness.   PHYSICAL EXAM: BP 130/78 (BP Location: Right Arm, Patient Position: Sitting, Cuff Size: Large)   Pulse (!) 54   Ht 5' (1.524 m)   Wt 233 lb (105.7 kg)   BMI 45.50 kg/m  General: Well developed AA female in no acute distress Head: Normocephalic and atraumatic Eyes:  Sclerae anicteric, conjunctiva pink. Ears: Normal auditory acuity Lungs: Clear throughout to auscultation; no W/R/R. Heart: Regular rate and rhythm; no M/R/G. Abdomen: Soft, obese, non-distended.  BS present.  Non-tender. Musculoskeletal: Symmetrical with no gross deformities  Skin: No lesions on visible  extremities Extremities: No edema  Neurological: Alert oriented x 4, grossly non-focal Psychological:  Alert and cooperative. Normal mood and affect  ASSESSMENT AND PLAN: *Diarrhea-type Irritable Bowel Syndrome/Chronic diarrhea with urgency and abdominal pain, consistent with diarrhea-type IBS.  Describes having IBS symptoms with diarrhea for several years.  Celiac testing negative.  Only felt improved while taking the metronidazole  for empiric treatment of SIBO, symptoms returned after completing the medication.  Colonoscopy in 2020 with random biopsies negative for microscopic colitis.  -Continue dicyclomine  10 mg twice daily. - Dr. Legrand had told her to limit the Lomotil  to emergency use only.  Had tried Imodium in the past and used it for several years, but lost efficacy.  She does have her gallbladder, but it may be worth just trying cholestyramine  1 packet daily at lunchtime to see if this helps solidify her stools.  Will send prescription to pharmacy. -?  If Viberzi would be an option. - Consider checking a pancreatic fecal elastase.  *Describes having nausea with certain foods smells recently: Not sure how to explain this.  She denies migraines.  None of her medications other than the dicyclomine  are new.  Does report a lot of issues with chronic sinusitis and postnasal drip so I wonder if this could be contributing.  This has only been present for the past couple of weeks.  She has been off of her pantoprazole  for the past week or so as she has been having trouble getting it from her PCPs office.  -Restart pantoprazole  40 mg twice daily as previously prescribed.  Will give this time and see if this helps the above situation.  **Follow-up in 10 to 12 weeks with myself or Dr. Legrand.  CC:  Yolanda Prentice DELENA Mickey., FNP

## 2024-09-02 ENCOUNTER — Ambulatory Visit (HOSPITAL_BASED_OUTPATIENT_CLINIC_OR_DEPARTMENT_OTHER): Admitting: Family

## 2024-09-02 ENCOUNTER — Encounter (HOSPITAL_BASED_OUTPATIENT_CLINIC_OR_DEPARTMENT_OTHER): Payer: Self-pay | Admitting: Family

## 2024-09-02 VITALS — BP 140/80 | HR 58 | Ht 60.0 in | Wt 235.0 lb

## 2024-09-02 DIAGNOSIS — R0609 Other forms of dyspnea: Secondary | ICD-10-CM | POA: Diagnosis not present

## 2024-09-02 DIAGNOSIS — I10 Essential (primary) hypertension: Secondary | ICD-10-CM | POA: Diagnosis not present

## 2024-09-02 DIAGNOSIS — R252 Cramp and spasm: Secondary | ICD-10-CM

## 2024-09-02 DIAGNOSIS — G4733 Obstructive sleep apnea (adult) (pediatric): Secondary | ICD-10-CM | POA: Diagnosis not present

## 2024-09-02 DIAGNOSIS — I422 Other hypertrophic cardiomyopathy: Secondary | ICD-10-CM | POA: Diagnosis not present

## 2024-09-02 NOTE — Progress Notes (Signed)
 Cardiology Office Note:  .   Date:  09/02/2024  ID:  Yolanda Lang, DOB 12/02/1946, MRN [id number] PCP: Arloa Jarvis, NP  [facility] Providers Cardiologist:  Shelda Bruckner, MD    History of Present Illness: .   Yolanda Lang is a 78 y.o. female with history of apical variant hypertrophic cardiomyopathy, hypertension, OSA on BiPAP  Prior echo 04/2021 normal LVEF 65 to 70%, severe LVH, grade 2 diastolic dysfunction, moderately elevated PASP, mild dilation ascending aorta 38 mm.  MRI stress test 06/2021 asymmetric LVH consistent with apical hypertrophic carciomyopathy, apical stress perfusion defect consistent with ischemia however stress perfusion defects also seen in apical HCM in absence of obstructive CAD. She was feeling well and preferred to defer ischemic eval.  Seen 05/2023 by Dr. Bruckner noting fatigue, central nonradiating chest discomfort, weakness in her legs.  Symptoms were overall improved after getting help 3 times per week as her husband had required increased care.  Previous echo and stress cardiac MRI supported apical hypertrophic cardiomyopathy.  Anatomic scan such as CT or cath discussed but she wished to hold on further testing.  At visit 09/29/23 her dyspnea was much improved after having additional help caregiving for her husband. Given improvement in symptoms, ischemic evaluation deferred.   Presents today for follow up independently. Her husband has dementia which is a lot of caretaking. Primary care concerned about fluid restriction. Mid August started having more fluid retention, difficulty with her breathing, wheezing, full of mucus. She had bilateral lower extremity edema up into her thighs. PCP increased Lasix from 40mg  to 60mg  which does make a difference. Short of breath at rest and with exertion despite increased dose Lasix. No orthopnea, PND - sleeps elevated but this is unchanged from previous. PCP added potassium 10mEq daily, she took one dose of wax  coated tablet and had GI side effects, diarrhea with BP drop 90/58 - she has not taken since that time. She has an appointment to see PCP on Monday - she was given information on high potassium foods. Reports her chest feels heavy at rest or with activity. She also notes generalized fatigue. Weight up 9 lbs compared to clinic visit 1 year ago.   ROS: Please see the history of present illness.    All other systems reviewed and are negative.   Studies Reviewed: .        Cardiac Studies & Procedures   ______________________________________________________________________________________________     ECHOCARDIOGRAM  ECHOCARDIOGRAM COMPLETE 04/23/2021  Narrative ECHOCARDIOGRAM REPORT    Patient Name:   Yolanda Lang   Date of Exam: 04/23/2021 Medical Rec #:  [id number]     Height:       59.0 in Accession #:    [id number]    Weight:       227.6 lb Date of Birth:  November 16, 1946      BSA:          1.949 m Patient Age:    75 years      BP:           140/88 mmHg Patient Gender: F             HR:           62 bpm. Exam Location:  [address]  Procedure: 2D Echo, 3D Echo, Cardiac Doppler and Color Doppler  Indications:    R06.00 Dyspnea  History:        Patient has no prior history of Echocardiogram examinations. CHF, Mitral Valve Prolapse; Risk Factors:Family  History of Coronary Artery Disease, Hypertension, Former Smoker and Sleep Apnea. Obesity, Edema, Palpitations.  Sonographer:    Heather Hawks RDCS Referring Phys: [id number] BRIDGETTE CHRISTOPHER  IMPRESSIONS   1. Left ventricular ejection fraction, by estimation, is 65 to 70%. The left ventricle has normal function. The left ventricle has no regional wall motion abnormalities. There is severe left ventricular hypertrophy. Left ventricular diastolic parameters are consistent with Grade II diastolic dysfunction (pseudonormalization). Elevated left atrial pressure. 2. Right ventricular systolic function is normal. The right  ventricular size is normal. There is moderately elevated pulmonary artery systolic pressure. The estimated right ventricular systolic pressure is 50.1 mmHg. 3. The mitral valve is normal in structure. Trivial mitral valve regurgitation. 4. The aortic valve was not well visualized. Aortic valve regurgitation is not visualized. No aortic stenosis is present. 5. Aortic dilatation noted. There is mild dilatation of the ascending aorta, measuring 38 mm. 6. Left atrial size was mildly dilated. 7. The inferior vena cava is normal in size with greater than 50% respiratory variability, suggesting right atrial pressure of 3 mmHg. 8. Severe LVH, most pronounced at the apex with cavity obliteration in systole. 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 Recommend cardiac MRI for further evaluation  FINDINGS Left Ventricle: Left ventricular ejection fraction, by estimation, is 65 to 70%. The left ventricle has normal function. The left ventricle has no regional wall motion abnormalities. 3D left ventricular ejection fraction analysis performed but not reported based on interpreter judgement due to suboptimal quality. The left ventricular internal cavity size was normal in size. There is severe left ventricular hypertrophy. Left ventricular diastolic parameters are consistent with Grade II diastolic dysfunction (pseudonormalization). Elevated left atrial pressure.  Right Ventricle: The right ventricular size is normal. No increase in right ventricular wall thickness. Right ventricular systolic function is normal. There is moderately elevated pulmonary artery systolic pressure. The tricuspid regurgitant velocity is 3.43 m/s, and with an assumed right atrial pressure of 3 mmHg, the estimated right ventricular systolic pressure is 50.1 mmHg.  Left Atrium: Left atrial size was mildly dilated.  Right Atrium: Right atrial size was normal in size.  Pericardium: There is no evidence of pericardial  effusion.  Mitral Valve: The mitral valve is normal in structure. Trivial mitral valve regurgitation.  Tricuspid Valve: The tricuspid valve is normal in structure. Tricuspid valve regurgitation is mild.  Aortic Valve: The aortic valve was not well visualized. Aortic valve regurgitation is not visualized. No aortic stenosis is present.  Pulmonic Valve: The pulmonic valve was not well visualized. Pulmonic valve regurgitation is not visualized.  Aorta: The aortic root is normal in size and structure and aortic dilatation noted. There is mild dilatation of the ascending aorta, measuring 38 mm.  Venous: The inferior vena cava is normal in size with greater than 50% respiratory variability, suggesting right atrial pressure of 3 mmHg.  IAS/Shunts: The interatrial septum was not well visualized.   LEFT VENTRICLE PLAX 2D LVIDd:         4.35 cm  Diastology LVIDs:         2.25 cm  LV e' medial:    7.29 cm/s LV PW:         1.25 cm  LV E/e' medial:  11.7 LV IVS:        1.05 cm  LV e' lateral:   6.53 cm/s LVOT diam:     2.10 cm  LV E/e' lateral: 13.1 LV SV:         89 LV SV Index:  46 LVOT Area:     3.46 cm  3D Volume EF: 3D EF:        78 % LV EDV:       155 ml LV ESV:       34 ml LV SV:        121 ml  RIGHT VENTRICLE TAPSE (M-mode): 1.8 cm  LEFT ATRIUM             Index       RIGHT ATRIUM           Index LA diam:        3.35 cm 1.72 cm/m  RA Area:     12.90 cm LA Vol (A2C):   67.2 ml 34.49 ml/m RA Volume:   28.10 ml  14.42 ml/m LA Vol (A4C):   71.4 ml 36.64 ml/m LA Biplane Vol: 74.3 ml 38.13 ml/m AORTIC VALVE LVOT Vmax:   99.00 cm/s LVOT Vmean:  70.900 cm/s LVOT VTI:    0.257 m  AORTA Ao Root diam: 3.20 cm Ao Asc diam:  3.65 cm  MITRAL VALVE               TRICUSPID VALVE MV Area (PHT): cm         TR Peak grad:   47.1 mmHg MV Decel Time: 236 msec    TR Vmax:        343.00 cm/s MV E velocity: 85.45 cm/s MV A velocity: 80.30 cm/s  SHUNTS MV E/A ratio:  1.06         Systemic VTI:  0.26 m Systemic Diam: 2.10 cm  Lonni Nanas MD Electronically signed by Lonni Nanas MD Signature Date/Time: 04/23/2021/10:25:23 PM    Final          ______________________________________________________________________________________________        Risk Assessment/Calculations:            Physical Exam:   VS:  BP (!) 140/80   Pulse (!) 58   Ht 5' (1.524 m)   Wt 235 lb (106.6 kg)   SpO2 97%   BMI 45.90 kg/m    Wt Readings from Last 3 Encounters:  09/02/24 235 lb (106.6 kg)  08/29/24 233 lb (105.7 kg)  06/01/24 232 lb (105.2 kg)    GEN: Well nourished, well developed in no acute distress NECK: No JVD; No carotid bruits CARDIAC: RRR, no murmurs, rubs, gallops RESPIRATORY:  Clear to auscultation without rales, wheezing or rhonchi  ABDOMEN: Soft, non-tender, non-distended EXTREMITIES:  2+ bilateral LE edema; No deformity   ASSESSMENT AND PLAN: .     Dyspnea on exertion/apical hypertrophic cardiomyopathy- increased weight, dyspnea, LE edema. Given her apical hypertrophic cardiomyopathy, will route to Dr. Santo to see if she is candidate for advanced therapies. Continue Amlodipine 5mg  daily (no diltiazem due to bradycardia), Toprol  50mg  daily.  Update echocardiogram to reassess.  Labs today BMET, CBC, magnesium, BNP.  Increase furosemide to 80mg  daily x 2 days then return to 60mg  daily.  Low sodium diet, fluid restriction <2L, and daily weights encouraged. Educated to contact our office for weight gain of 2 lbs overnight or 5 lbs in one week.   Hypertension- BP note well controlled in setting of volume overload as above. Adjusting diuresis with hopeful improvement in BP. Discussed to monitor BP at home at least 2 hours after medications and sitting for 5-10 minutes. Continue Toprol  50mg  daily, valsartan 320mg  daily, amlodipine 5mg  daily.  .    OSA- BIPAP compliance encouraged. Wearing BIPAP  regularly.           Dispo: follow up  in 6 weeks  Signed, Reche GORMAN Finder, NP

## 2024-09-02 NOTE — Patient Instructions (Signed)
 Medication Instructions:   CHANGE Furosemide to 80mg  daily for 2 days  After 2 days, return to 60mg  (1.5 tablets) daily  *If you need a refill on your cardiac medications before your next appointment, please call your pharmacy*  Lab Work: Your physician recommends that you return for lab work today: BMET, CBC, magnesium, BNP  If you have labs (blood work) drawn today and your tests are completely normal, you will receive your results only by: MyChart Message (if you have MyChart) OR A paper copy in the mail If you have any lab test that is abnormal or we need to change your treatment, we will call you to review the results.  Testing/Procedures: Your EKG today was stable from previous.  Your physician has requested that you have an echocardiogram. Echocardiography is a painless test that uses sound waves to create images of your heart. It provides your doctor with information about the size and shape of your heart and how well your heart's chambers and valves are working. This procedure takes approximately one hour. There are no restrictions for this procedure. Please do NOT wear cologne, perfume, aftershave, or lotions (deodorant is allowed). Please arrive 15 minutes prior to your appointment time.  Please note: We ask at that you not bring children with you during ultrasound (echo/ vascular) testing. Due to room size and safety concerns, children are not allowed in the ultrasound rooms during exams. Our front office staff cannot provide observation of children in our lobby area while testing is being conducted. An adult accompanying a patient to their appointment will only be allowed in the ultrasound room at the discretion of the ultrasound technician under special circumstances. We apologize for any inconvenience.   Follow-Up: At [facility], you and your health needs are our priority.  As part of our continuing mission to provide you with exceptional heart care, our providers  are all part of one team.  This team includes your primary Cardiologist (physician) and Advanced Practice Providers or APPs (Physician Assistants and Nurse Practitioners) who all work together to provide you with the care you need, when you need it.  Your next appointment:   6 week(s)  Provider:   Shelda Bruckner, MD, Rosaline Bane, NP, or Reche Finder, NP    We recommend signing up for the patient portal called MyChart.  Sign up information is provided on this After Visit Summary.  MyChart is used to connect with patients for Virtual Visits (Telemedicine).  Patients are able to view lab/test results, encounter notes, upcoming appointments, etc.  Non-urgent messages can be sent to your provider as well.   To learn more about what you can do with MyChart, go to [email or web address].   Other Instructions  We are sending your information to our cardiologist who specializes in hypertrophic cardiomyopathy to see if you qualify for advanced therapies for your apical hypertrophic cardiomyopathy.

## 2024-09-04 ENCOUNTER — Encounter (HOSPITAL_BASED_OUTPATIENT_CLINIC_OR_DEPARTMENT_OTHER): Payer: Self-pay | Admitting: Family

## 2024-09-04 LAB — BASIC METABOLIC PANEL WITH GFR
BUN/Creatinine Ratio: 11 — ABNORMAL LOW (ref 12–28)
BUN: 12 mg/dL (ref 8–27)
CO2: 27 mmol/L (ref 20–29)
Calcium: 9.7 mg/dL (ref 8.7–10.3)
Chloride: 101 mmol/L (ref 96–106)
Creatinine, Ser: 1.05 mg/dL — ABNORMAL HIGH (ref 0.57–1.00)
Glucose: 92 mg/dL (ref 70–99)
Potassium: 3.8 mmol/L (ref 3.5–5.2)
Sodium: 144 mmol/L (ref 134–144)
eGFR: 54 mL/min/1.73 — ABNORMAL LOW (ref >59)

## 2024-09-04 LAB — CBC
Hematocrit: 40.7 % (ref 34.0–46.6)
Hemoglobin: 13.3 g/dL (ref 11.1–15.9)
MCH: 28.9 pg (ref 26.6–33.0)
MCHC: 32.7 g/dL (ref 31.5–35.7)
MCV: 88 fL (ref 79–97)
Platelets: 272 x10E3/uL (ref 150–450)
RBC: 4.61 x10E6/uL (ref 3.77–5.28)
RDW: 13 % (ref 11.7–15.4)
WBC: 8 x10E3/uL (ref 3.4–10.8)

## 2024-09-04 LAB — BRAIN NATRIURETIC PEPTIDE: BNP: 212.2 pg/mL — ABNORMAL HIGH (ref 0.0–100.0)

## 2024-09-04 LAB — MAGNESIUM: Magnesium: 1.8 mg/dL (ref 1.6–2.3)

## 2024-09-05 ENCOUNTER — Ambulatory Visit (HOSPITAL_BASED_OUTPATIENT_CLINIC_OR_DEPARTMENT_OTHER): Payer: Self-pay | Admitting: Family

## 2024-10-03 ENCOUNTER — Ambulatory Visit (INDEPENDENT_AMBULATORY_CARE_PROVIDER_SITE_OTHER)

## 2024-10-03 DIAGNOSIS — R0609 Other forms of dyspnea: Secondary | ICD-10-CM | POA: Diagnosis not present

## 2024-10-03 DIAGNOSIS — I422 Other hypertrophic cardiomyopathy: Secondary | ICD-10-CM

## 2024-10-03 LAB — ECHOCARDIOGRAM COMPLETE
Area-P 1/2: 3.72 cm2
S' Lateral: 2.82 cm

## 2024-10-03 MED ORDER — PERFLUTREN LIPID MICROSPHERE
1.0000 mL | INTRAVENOUS | Status: AC | PRN
  Administered 2024-10-03: 1 mL via INTRAVENOUS

## 2024-10-04 NOTE — Telephone Encounter (Signed)
 Pt returning call

## 2024-10-14 ENCOUNTER — Ambulatory Visit (HOSPITAL_BASED_OUTPATIENT_CLINIC_OR_DEPARTMENT_OTHER): Admitting: Family

## 2024-10-14 ENCOUNTER — Encounter (HOSPITAL_BASED_OUTPATIENT_CLINIC_OR_DEPARTMENT_OTHER): Payer: Self-pay | Admitting: Family

## 2024-10-14 VITALS — BP 130/78 | HR 57 | Ht 60.0 in | Wt 237.1 lb

## 2024-10-14 DIAGNOSIS — I10 Essential (primary) hypertension: Secondary | ICD-10-CM

## 2024-10-14 DIAGNOSIS — I422 Other hypertrophic cardiomyopathy: Secondary | ICD-10-CM | POA: Diagnosis not present

## 2024-10-14 DIAGNOSIS — I5032 Chronic diastolic (congestive) heart failure: Secondary | ICD-10-CM

## 2024-10-14 DIAGNOSIS — G4733 Obstructive sleep apnea (adult) (pediatric): Secondary | ICD-10-CM

## 2024-10-14 MED ORDER — DAPAGLIFLOZIN PROPANEDIOL 10 MG PO TABS: 10.0000 mg | ORAL_TABLET | Freq: Every day | ORAL | 0 refills | Status: AC

## 2024-10-14 MED ORDER — DAPAGLIFLOZIN PROPANEDIOL 10 MG PO TABS: 10.0000 mg | ORAL_TABLET | Freq: Every day | ORAL | 3 refills | Status: AC

## 2024-10-14 NOTE — Progress Notes (Signed)
 Cardiology Office Note:  .   Date:  10/14/2024  ID:  Yolanda Lang, DOB [birth year], MRN [id number] PCP: Arloa Jarvis, NP  [facility] Providers Cardiologist:  Shelda Bruckner, MD    History of Present Illness: .   Yolanda Lang is a 78 y.o. female with history of apical variant hypertrophic cardiomyopathy, hypertension, OSA on BiPAP  Prior echo 04/2021 normal LVEF 65 to 70%, severe LVH, grade 2 diastolic dysfunction, moderately elevated PASP, mild dilation ascending aorta 38 mm.  MRI stress test 06/2021 asymmetric LVH consistent with apical hypertrophic carciomyopathy, apical stress perfusion defect consistent with ischemia however stress perfusion defects also seen in apical HCM in absence of obstructive CAD. She was feeling well and preferred to defer ischemic eval.  Seen 05/2023 by Dr. Bruckner noting fatigue, central nonradiating chest discomfort, weakness in her legs.  Symptoms were overall improved after getting help 3 times per week as her husband had required increased care.  Previous echo and stress cardiac MRI supported apical hypertrophic cardiomyopathy.  Anatomic scan such as CT or cath discussed but she wished to hold on further testing.  At visit 09/29/23 her dyspnea was much improved after having additional help caregiving for her husband. Given improvement in symptoms, ischemic evaluation deferred.   She was seen 09/02/24 with increased edema, bilateral LE edema with some improvement after PCP increased Lasix from 40 to 60mg . BNP 212. Lasix adjusted to 80mg  x 2 days then return to 60mg . Updated echo 10/03/24 normal LVEF 60-65%, severe apical LVH, gr2dd, modreately elevated PASP, mild to moderate MR/TR.   Presents today for follow up independently. Her husband has dementia which is a lot of caretaking. She reports feeling better than her last visit with improvement in shortness of breath. She notes an intermittent uneasiness in her chest, with improvement in chest  heaviness or fullness as previously reported. Still notes bilateral LE edema, L>R, and reports h/o remote left ankle fracture. She wears compression stockings with improvement in edema, however will wake up with swelling in morning. Denies chest pain, lightheadedness, dizziness. Sleeps with BiPAP, 1 pillow, elevated head of bed at baseline. Does not weigh at home.   Home BP average: 130s/70s-80s Home HR average: 60s  Reviewed echo and lab results. Discussed addition of SGLTi for diastolic HF benefit. No reported history of recurrent UTI, yeast infections.   She is currently taking metoprolol  succinate - both 25 and 50mg  tablets daily.  Reports her PCP increased the dose for blood pressure control.  ROS: Please see the history of present illness.    All other systems reviewed and are negative.   Studies Reviewed: .        Cardiac Studies & Procedures   ______________________________________________________________________________________________     ECHOCARDIOGRAM  ECHOCARDIOGRAM COMPLETE 10/03/2024  Narrative ECHOCARDIOGRAM REPORT    Patient Name:   Yolanda Lang Date of Exam: 10/03/2024 Medical Rec #:  [id number]   Height:       60.0 in Accession #:    [id number]  Weight:       235.0 lb Date of Birth:  1946/07/02    BSA:          1.999 m Patient Age:    78 years    BP:           139/83 mmHg Patient Gender: F           HR:           56 bpm. Exam Location:  Outpatient  Procedure: 2D Echo,  3D Echo, Color Doppler, Cardiac Doppler, Strain Analysis and Intracardiac Opacification Agent (Both Spectral and Color Flow Doppler were utilized during procedure).  Indications:     CHF  History:         Patient has prior history of Echocardiogram examinations, most recent 04/23/2021. CHF, Signs/Symptoms:Chest Pain; Risk Factors:Hypertension and Former Smoker. Apical variant hypertrophic cardiomyopathy.  Sonographer:     Orvil Holmes RDCS Referring Phys:  [id number] RECHE GORMAN FINDER Diagnosing Phys: Darryle Decent MD   Sonographer Comments: 30 minutes post definity IMPRESSIONS   1. Severe asymmetric hypertrophy of the apical segments consistent with apical variant HCM. Left ventricular ejection fraction, by estimation, is 60 to 65%. The left ventricle has normal function. The left ventricle has no regional wall motion abnormalities. There is severe asymmetric left ventricular hypertrophy of the apical segment. Left ventricular diastolic parameters are consistent with Grade II diastolic dysfunction (pseudonormalization). The average left ventricular global longitudinal strain is -11.5 %. The global longitudinal strain is abnormal. 2. Right ventricular systolic function is normal. The right ventricular size is normal. There is moderately elevated pulmonary artery systolic pressure. The estimated right ventricular systolic pressure is 48.7 mmHg. 3. Left atrial size was mildly dilated. 4. The mitral valve is grossly normal. Mild to moderate mitral valve regurgitation. No evidence of mitral stenosis. 5. Tricuspid valve regurgitation is mild to moderate. 6. The aortic valve is tricuspid. Aortic valve regurgitation is not visualized. No aortic stenosis is present. 7. The inferior vena cava is normal in size with greater than 50% respiratory variability, suggesting right atrial pressure of 3 mmHg.  Comparison(s): No significant change from prior study. 04/2021 normal LVEF 65 to 70%, severe LVH, grade 2 diastolic dysfunction, moderately elevated PASP, mild dilation ascending aorta 38 mm.  FINDINGS Left Ventricle: Severe asymmetric hypertrophy of the apical segments consistent with apical variant HCM. Left ventricular ejection fraction, by estimation, is 60 to 65%. The left ventricle has normal function. The left ventricle has no regional wall motion abnormalities. Definity contrast agent was given IV to delineate the left ventricular endocardial borders. The average left  ventricular global longitudinal strain is -11.5 %. Strain was performed and the global longitudinal strain is abnormal. 3D ejection fraction reviewed and evaluated as part of the interpretation. Alternate measurement of EF is felt to be most reflective of LV function. The left ventricular internal cavity size was normal in size. There is severe asymmetric left ventricular hypertrophy of the apical segment. Left ventricular diastolic parameters are consistent with Grade II diastolic dysfunction (pseudonormalization).  Right Ventricle: The right ventricular size is normal. No increase in right ventricular wall thickness. Right ventricular systolic function is normal. There is moderately elevated pulmonary artery systolic pressure. The tricuspid regurgitant velocity is 3.38 m/s, and with an assumed right atrial pressure of 3 mmHg, the estimated right ventricular systolic pressure is 48.7 mmHg.  Left Atrium: Left atrial size was mildly dilated.  Right Atrium: Right atrial size was normal in size.  Pericardium: There is no evidence of pericardial effusion.  Mitral Valve: The mitral valve is grossly normal. Mild to moderate mitral valve regurgitation. No evidence of mitral valve stenosis.  Tricuspid Valve: The tricuspid valve is grossly normal. Tricuspid valve regurgitation is mild to moderate. No evidence of tricuspid stenosis.  Aortic Valve: The aortic valve is tricuspid. Aortic valve regurgitation is not visualized. No aortic stenosis is present.  Pulmonic Valve: The pulmonic valve was grossly normal. Pulmonic valve regurgitation is not visualized. No evidence of pulmonic stenosis.  Aorta: The aortic root and ascending aorta are structurally normal, with no evidence of dilitation.  Venous: The inferior vena cava is normal in size with greater than 50% respiratory variability, suggesting right atrial pressure of 3 mmHg.  IAS/Shunts: The atrial septum is grossly normal.  Additional Comments: 3D  was performed not requiring image post processing on an independent workstation and was indeterminate.   LEFT VENTRICLE PLAX 2D LVIDd:         4.74 cm   Diastology LVIDs:         2.82 cm   LV e' medial:    4.35 cm/s LV PW:         1.11 cm   LV E/e' medial:  16.4 LV IVS:        0.95 cm   LV e' lateral:   8.59 cm/s LVOT diam:     1.90 cm   LV E/e' lateral: 8.3 LV SV:         52 LV SV Index:   26        2D Longitudinal Strain LVOT Area:     2.84 cm  2D Strain GLS (A4C):   -9.7 % 2D Strain GLS (A3C):   -11.6 % 2D Strain GLS (A2C):   -13.1 % 2D Strain GLS Avg:     -11.5 %  3D Volume EF: 3D EF:        56 % LV EDV:       124 ml LV ESV:       54 ml LV SV:        70 ml  RIGHT VENTRICLE RV Basal diam:  3.55 cm     PULMONARY VEINS RV Mid diam:    2.37 cm     A Reversal Velocity: 20.50 cm/s RV S prime:     12.60 cm/s  Diastolic Velocity:  56.60 cm/s TAPSE (M-mode): 2.2 cm      S/D Velocity:        0.90 Systolic Velocity:   52.50 cm/s  LEFT ATRIUM           Index        RIGHT ATRIUM           Index LA diam:      4.10 cm 2.05 cm/m   RA Area:     15.00 cm LA Vol (A2C): 47.0 ml 23.51 ml/m  RA Volume:   39.60 ml  19.81 ml/m LA Vol (A4C): 68.1 ml 34.07 ml/m AORTIC VALVE LVOT Vmax:   83.30 cm/s LVOT Vmean:  52.800 cm/s LVOT VTI:    0.185 m  AORTA Ao Root diam: 2.90 cm Ao Asc diam:  3.50 cm  MITRAL VALVE               TRICUSPID VALVE MV Area (PHT): 3.72 cm    TR Peak grad:   45.7 mmHg MV Decel Time: 204 msec    TR Vmax:        338.00 cm/s MV E velocity: 71.50 cm/s MV A velocity: 39.90 cm/s  SHUNTS MV E/A ratio:  1.79        Systemic VTI:  0.18 m Systemic Diam: 1.90 cm  Darryle Decent MD Electronically signed by Darryle Decent MD Signature Date/Time: 10/03/2024/1:36:18 PM    Final (Updated)          ______________________________________________________________________________________________          Risk Assessment/Calculations:            Physical Exam:  VS:  BP (!) 150/84   Pulse (!) 57   Ht 5' (1.524 m)   Wt 107.5 kg   SpO2 97%   BMI 46.31 kg/m    Wt Readings from Last 3 Encounters:  10/14/24 107.5 kg  09/02/24 106.6 kg  08/29/24 105.7 kg    GEN: Well nourished, well developed in no acute distress NECK: No JVD; No carotid bruits CARDIAC: RRR, no murmurs, rubs, gallops; radial and PT pulses 2+ bilaterally RESPIRATORY:  Clear to auscultation without rales, wheezing or rhonchi  ABDOMEN: Soft, non-tender, non-distended EXTREMITIES:  2+ LLE edema, 1+ non-pitting RLE edema; No deformity   ASSESSMENT AND PLAN: .     Dyspnea on exertion/apical hypertrophic cardiomyopathy / Diastolic heart failure- Given her apical hypertrophic cardiomyopathy, discussed with Dr. Santo - no specific advanced therapy for apical HCM but advised to consider addition of SGLT2i. Echo 09/2024: LVEF 60-65%, several asymmetric hypertrophy, G2DD, mild-mod MR/TR.  Continue Amlodipine 5mg  daily (no diltiazem due to bradycardia), Toprol  25mg  + 50mg  daily.  Will start farxiga 10mg  daily after discussion with patient. 2 weeks samples provided.  GDMT: valsartan 320mg  daily, furosemide 40mg  daily, metoprolol  succinate 25mg  and 50mg  daily (PCP increased due to BP control - if BP controlled at follow up consider reducing to 50mg  dose), farxiga 10mg  daily Lab work in 1-2 weeks - BMET Low sodium diet, fluid restriction <2L, and daily weights encouraged. Educated to contact our office for weight gain of 2 lbs overnight or 5 lbs in one week.   Hypertension, goal <130/80 - controlled, at goal by in office and home readings;  Continue amlodipine 5mg  daily, metoprolol  succinate 25mg  + 50mg  daily (PCP increased due to BP control - if BP controlled at follow up consider reducing to 50mg  dose), valsartan 320mg  daily, furosemide 60mg  daily   OSA- BIPAP compliance encouraged. Wearing BIPAP regularly.           Dispo: follow up in 6-8 weeks  Signed,  Reche GORMAN Finder, NP

## 2024-10-14 NOTE — Patient Instructions (Signed)
 Medication Instructions:   START Farxiga one (1) tablet by mouth ( 10 mg) daily.   *If you need a refill on your cardiac medications before your next appointment, please call your pharmacy*  Lab Work:  Your physician recommends that you return for lab work in one - two weeks.    If you have labs (blood work) drawn today and your tests are completely normal, you will receive your results only by: MyChart Message (if you have MyChart) OR A paper copy in the mail If you have any lab test that is abnormal or we need to change your treatment, we will call you to review the results.  Testing/Procedures:  None ordered.   Follow-Up: At [facility], you and your health needs are our priority.  As part of our continuing mission to provide you with exceptional heart care, our providers are all part of one team.  This team includes your primary Cardiologist (physician) and Advanced Practice Providers or APPs (Physician Assistants and Nurse Practitioners) who all work together to provide you with the care you need, when you need it.  Your next appointment:   6 week(s)  Provider:   Shelda Bruckner, MD    We recommend signing up for the patient portal called MyChart.  Sign up information is provided on this After Visit Summary.  MyChart is used to connect with patients for Virtual Visits (Telemedicine).  Patients are able to view lab/test results, encounter notes, upcoming appointments, etc.  Non-urgent messages can be sent to your provider as well.   To learn more about what you can do with MyChart, go to [email or web address].

## 2024-10-27 ENCOUNTER — Telehealth: Payer: Self-pay | Admitting: Pharmacy Technician

## 2024-10-27 ENCOUNTER — Other Ambulatory Visit (HOSPITAL_COMMUNITY): Payer: Self-pay

## 2024-10-27 ENCOUNTER — Telehealth (HOSPITAL_BASED_OUTPATIENT_CLINIC_OR_DEPARTMENT_OTHER): Payer: Self-pay | Admitting: Cardiology

## 2024-10-27 NOTE — Telephone Encounter (Signed)
 Patient calling the office for samples of medication:   1.  What medication and dosage are you requesting samples for?  dapagliflozin propanediol (FARXIGA) 10 MG TABS tablet   2.  Are you currently out of this medication?   Patient stated she only has medication for today and noted she will be in the area for lab work tomorrow.

## 2024-10-27 NOTE — Telephone Encounter (Signed)
 Insurance won't pay for generic. They said brand is required. Copay is 151.05 for 30 days and 453 for 90 days.   Patient Advocate Encounter   The patient was approved for a [employer] grant that will help cover the cost of farxiga Total amount awarded, 7500.  Effective: 09/27/24 - 09/26/25   APW:[id number] ERW:[id number] Hmnle:[id number] PI:[id number] [employer] ID: [id number]   Pharmacy provided with approval and processing information. Patient given information via phone

## 2024-10-27 NOTE — Telephone Encounter (Signed)
 She may stop by the lab tomorrow, labs already ordered.   Rx already sent to her pharmacy [facility] in [place]. She should be able to pick it up. Will CC PA team to make sure PA not needed.  Saniyah Mondesir S Aryka Coonradt, NP

## 2024-10-27 NOTE — Telephone Encounter (Signed)
 You are the best as always! Thank you for all you do.   Firmin Belisle S Lendy Dittrich, NP

## 2024-10-27 NOTE — Telephone Encounter (Addendum)
 Returned a call back to the pt.   Pt aware we sent [facility] [place], and per note copied below from our PA Team, she was approved for [employer] for total amount of 7500.  Pt aware that our PA Team contacted her pharmacy about this and they are processing this information now for the pt.   Advised the pt to keep her lab appt as scheduled for tomorrow.   Advised the pt to call us  back if there is any further issues with getting this script filled with [employer] grant information.  Pt verbalized understanding and agrees with this plan.   Insurance won't pay for generic. They said brand is required. Copay is 151.05 for 30 days and 453 for 90 days.    Patient Advocate Encounter   The patient was approved for a [employer] grant that will help cover the cost of farxiga Total amount awarded, 7500.  Effective: 09/27/24 - 09/26/25   APW:[id number] ERW:[id number] Hmnle:[id number] PI:[id number] [employer] ID: [id number]   Pharmacy provided with approval and processing information.

## 2024-10-28 LAB — BASIC METABOLIC PANEL WITH GFR
BUN/Creatinine Ratio: 13 (ref 12–28)
BUN: 12 mg/dL (ref 8–27)
CO2: 21 mmol/L (ref 20–29)
Calcium: 9.4 mg/dL (ref 8.7–10.3)
Chloride: 104 mmol/L (ref 96–106)
Creatinine, Ser: 0.9 mg/dL (ref 0.57–1.00)
Glucose: 113 mg/dL — ABNORMAL HIGH (ref 70–99)
Potassium: 3.6 mmol/L (ref 3.5–5.2)
Sodium: 143 mmol/L (ref 134–144)
eGFR: 65 mL/min/1.73 (ref >59)

## 2024-10-31 ENCOUNTER — Ambulatory Visit (HOSPITAL_BASED_OUTPATIENT_CLINIC_OR_DEPARTMENT_OTHER): Payer: Self-pay | Admitting: Family

## 2024-11-07 ENCOUNTER — Ambulatory Visit: Admitting: Gastroenterology

## 2024-11-25 ENCOUNTER — Other Ambulatory Visit: Payer: Self-pay | Admitting: Gastroenterology

## 2024-11-25 DIAGNOSIS — R140 Abdominal distension (gaseous): Secondary | ICD-10-CM

## 2024-11-25 DIAGNOSIS — R1031 Right lower quadrant pain: Secondary | ICD-10-CM

## 2024-11-25 DIAGNOSIS — K580 Irritable bowel syndrome with diarrhea: Secondary | ICD-10-CM

## 2024-11-25 DIAGNOSIS — R198 Other specified symptoms and signs involving the digestive system and abdomen: Secondary | ICD-10-CM

## 2024-12-02 ENCOUNTER — Ambulatory Visit (HOSPITAL_BASED_OUTPATIENT_CLINIC_OR_DEPARTMENT_OTHER): Admitting: Cardiology

## 2024-12-02 NOTE — Progress Notes (Incomplete)
 " Cardiology Office Note:  .   Date:  12/02/2024  ID:  Jenkins Mirza, DOB 08-20-1946, MRN [id number] PCP: Arloa Jarvis, NP  [facility] Providers Cardiologist:  Shelda Bruckner, MD {  History of Present Illness: .   Yolanda Lang is a 78 y.o. female with a hx of apical variant hypertrophic cardiomyopathy, hypertension, OSA on BiPAP who is seen for follow up. I initially met her 10/01/2020 as a new consult at the request of Claudene Prentice DELENA Mickey., FNP for the evaluation and management of possible history of heart failure.  CV history: echo 04/2021 normal LVEF 65 to 70%, severe LVH, grade 2 diastolic dysfunction, moderately elevated PASP, mild dilation ascending aorta 38 mm. MRI stress test 06/2021 asymmetric LVH consistent with apical hypertrophic carciomyopathy, apical stress perfusion defect consistent with ischemia however stress perfusion defects also seen in apical HCM in absence of obstructive CAD. She was feeling well and preferred to defer ischemic eval. Updated echo 10/03/24 normal LVEF 60-65%, severe apical LVH, gr2dd, modreately elevated PASP, mild to moderate MR/TR.   Today:   ROS: Denies shortness of breath at rest or with normal exertion. No PND, orthopnea, LE edema or unexpected weight gain. No syncope or palpitations. ROS otherwise negative except as noted.   Studies Reviewed: SABRA    EKG:       Physical Exam:   VS:  There were no vitals taken for this visit.   Wt Readings from Last 3 Encounters:  10/14/24 237 lb 1.6 oz (107.5 kg)  09/02/24 235 lb (106.6 kg)  08/29/24 233 lb (105.7 kg)    GEN: Well nourished, well developed in no acute distress HEENT: Normal, moist mucous membranes NECK: No JVD CARDIAC: regular rhythm, normal S1 and S2, no rubs or gallops. No murmur. VASCULAR: Radial and DP pulses 2+ bilaterally. No carotid bruits RESPIRATORY:  Clear to auscultation without rales, wheezing or rhonchi  ABDOMEN: Soft, non-tender, non-distended MUSCULOSKELETAL:   Ambulates independently SKIN: Warm and dry, no edema NEUROLOGIC:  Alert and oriented x 3. No focal neuro deficits noted. PSYCHIATRIC:  Normal affect    ASSESSMENT AND PLAN: .   LE edema Chronic diastolic heart failure Dyspnea on exertion Apical hypertrophic cardiomyopathy on echo -see echo and stress cardiac MRI results, supports Apical HCM -MRI stress with abnormal apical perfusion defect, can be seen with apical HCM or CAD, patient declined further ischemic evaluation - continue metoprolol , valsartan, furosemide, farxiga  -Reche Finder discussed with Dr. Santo, no specific treatment for apical HCM, recommended SGLT2i, started at last visit   Hypertension: -longstanding, has been on medication since 1978 -continue valsartan 320 mg daily, furosemide 40 mg daily, amlodipine 5 mg daily, metoprolol  succinate 50 mg daily, farxiga  as above  OSA:  -continue BIPAP  CV risk counseling and prevention -recommend heart healthy/Mediterranean diet, with whole grains, fruits, vegetable, fish, lean meats, nuts, and olive oil. Limit salt. -recommend moderate walking, 3-5 times/week for 30-50 minutes each session. Aim for at least 150 minutes.week. Goal should be pace of 3 miles/hours, or walking 1.5 miles in 30 minutes -recommend avoidance of tobacco products. Avoid excess alcohol. -would not start aspirin given history of lower GI bleed/diverticulosis unless significant CAD noted   Dispo: 4 mos (6 mos from prior scheduled visit)  Signed, Shelda Bruckner, MD   Shelda Bruckner, MD, PhD, [facility]  [facility]  [place]  [facility] at [facility] at [facility] [address], [address] [place], [place] [address] [phone]   "

## 2025-01-05 ENCOUNTER — Other Ambulatory Visit (HOSPITAL_BASED_OUTPATIENT_CLINIC_OR_DEPARTMENT_OTHER): Payer: Self-pay | Admitting: Family

## 2025-01-05 DIAGNOSIS — I422 Other hypertrophic cardiomyopathy: Secondary | ICD-10-CM
